# Patient Record
Sex: Female | Born: 1935
Health system: Southern US, Community
[De-identification: ages and names within clinical notes are randomized; demographics above are authoritative.]

## PROBLEM LIST (undated history)

## (undated) DIAGNOSIS — C50919 Malignant neoplasm of unspecified site of unspecified female breast: Secondary | ICD-10-CM

## (undated) DIAGNOSIS — C801 Malignant (primary) neoplasm, unspecified: Secondary | ICD-10-CM

## (undated) HISTORY — PX: CHOLECYSTECTOMY: SHX55

## (undated) HISTORY — PX: BREAST LUMPECTOMY: SHX2

## (undated) HISTORY — DX: Malignant neoplasm of unspecified site of unspecified female breast: C50.919

## (undated) HISTORY — DX: Malignant (primary) neoplasm, unspecified: C80.1

---

## 2007-09-12 DIAGNOSIS — C50211 Malignant neoplasm of upper-inner quadrant of right female breast: Secondary | ICD-10-CM

## 2007-09-12 DIAGNOSIS — C50919 Malignant neoplasm of unspecified site of unspecified female breast: Secondary | ICD-10-CM

## 2007-09-12 HISTORY — DX: Malignant neoplasm of unspecified site of unspecified female breast: C50.919

## 2007-10-09 ENCOUNTER — Ambulatory Visit: Payer: Self-pay | Admitting: Oncology

## 2007-10-11 ENCOUNTER — Encounter: Admission: RE | Admit: 2007-10-11 | Discharge: 2007-10-11 | Payer: Self-pay | Admitting: Surgery

## 2007-10-30 ENCOUNTER — Encounter: Admission: RE | Admit: 2007-10-30 | Discharge: 2007-10-30 | Payer: Self-pay | Admitting: Surgery

## 2007-11-12 ENCOUNTER — Encounter (INDEPENDENT_AMBULATORY_CARE_PROVIDER_SITE_OTHER): Payer: Self-pay | Admitting: Surgery

## 2007-11-12 ENCOUNTER — Ambulatory Visit (HOSPITAL_COMMUNITY): Admission: RE | Admit: 2007-11-12 | Discharge: 2007-11-12 | Payer: Self-pay | Admitting: Surgery

## 2007-11-15 ENCOUNTER — Ambulatory Visit: Payer: Self-pay | Admitting: Genetic Counselor

## 2007-11-28 LAB — CBC WITH DIFFERENTIAL/PLATELET
BASO%: 0.3 % (ref 0.0–2.0)
HCT: 40.7 % (ref 34.8–46.6)
LYMPH%: 28.1 % (ref 14.0–48.0)
MCH: 30 pg (ref 26.0–34.0)
MCHC: 33.8 g/dL (ref 32.0–36.0)
MCV: 88.8 fL (ref 81.0–101.0)
MONO#: 0.4 10*3/uL (ref 0.1–0.9)
MONO%: 7.6 % (ref 0.0–13.0)
NEUT%: 59.8 % (ref 39.6–76.8)
Platelets: 240 10*3/uL (ref 145–400)
RBC: 4.58 10*6/uL (ref 3.70–5.32)
WBC: 5.4 10*3/uL (ref 3.9–10.0)

## 2007-11-29 LAB — COMPREHENSIVE METABOLIC PANEL
ALT: 347 U/L — ABNORMAL HIGH (ref 0–35)
Alkaline Phosphatase: 241 U/L — ABNORMAL HIGH (ref 39–117)
CO2: 27 mEq/L (ref 19–32)
Creatinine, Ser: 0.94 mg/dL (ref 0.40–1.20)
Total Bilirubin: 0.7 mg/dL (ref 0.3–1.2)

## 2007-11-29 LAB — CANCER ANTIGEN 27.29: CA 27.29: 13 U/mL (ref 0–39)

## 2007-11-29 LAB — VITAMIN D 25 HYDROXY (VIT D DEFICIENCY, FRACTURES): Vit D, 25-Hydroxy: 30 ng/mL (ref 30–89)

## 2007-11-29 LAB — LACTATE DEHYDROGENASE: LDH: 225 U/L (ref 94–250)

## 2007-12-03 ENCOUNTER — Ambulatory Visit (HOSPITAL_COMMUNITY): Admission: RE | Admit: 2007-12-03 | Discharge: 2007-12-03 | Payer: Self-pay | Admitting: Oncology

## 2007-12-03 LAB — HEPATIC FUNCTION PANEL
Albumin: 4.3 g/dL (ref 3.5–5.2)
Total Protein: 7 g/dL (ref 6.0–8.3)

## 2007-12-03 LAB — GAMMA GT: GGT: 449 U/L — ABNORMAL HIGH (ref 7–51)

## 2007-12-24 ENCOUNTER — Encounter (INDEPENDENT_AMBULATORY_CARE_PROVIDER_SITE_OTHER): Payer: Self-pay | Admitting: Surgery

## 2007-12-24 ENCOUNTER — Ambulatory Visit (HOSPITAL_COMMUNITY): Admission: RE | Admit: 2007-12-24 | Discharge: 2007-12-24 | Payer: Self-pay | Admitting: Surgery

## 2008-01-01 ENCOUNTER — Ambulatory Visit: Payer: Self-pay | Admitting: Genetic Counselor

## 2008-01-01 LAB — HEPATIC FUNCTION PANEL
ALT: 69 U/L — ABNORMAL HIGH (ref 0–35)
AST: 42 U/L — ABNORMAL HIGH (ref 0–37)
Alkaline Phosphatase: 112 U/L (ref 39–117)
Bilirubin, Direct: 0.1 mg/dL (ref 0.0–0.3)
Indirect Bilirubin: 0.3 mg/dL (ref 0.0–0.9)
Total Protein: 6.9 g/dL (ref 6.0–8.3)

## 2008-01-11 ENCOUNTER — Ambulatory Visit: Admission: RE | Admit: 2008-01-11 | Discharge: 2008-03-21 | Payer: Self-pay | Admitting: Radiation Oncology

## 2008-03-13 ENCOUNTER — Ambulatory Visit: Payer: Self-pay | Admitting: Genetic Counselor

## 2008-06-06 ENCOUNTER — Ambulatory Visit: Payer: Self-pay | Admitting: Genetic Counselor

## 2008-06-10 LAB — CBC WITH DIFFERENTIAL/PLATELET
BASO%: 0.4 % (ref 0.0–2.0)
Basophils Absolute: 0 10*3/uL (ref 0.0–0.1)
HGB: 12.8 g/dL (ref 11.6–15.9)
MCH: 31.4 pg (ref 25.1–34.0)
MONO%: 7.8 % (ref 0.0–14.0)
NEUT#: 2.9 10*3/uL (ref 1.5–6.5)
Platelets: 224 10*3/uL (ref 145–400)
RBC: 4.06 10*6/uL (ref 3.70–5.45)
WBC: 4.6 10*3/uL (ref 3.9–10.3)
lymph#: 1.2 10*3/uL (ref 0.9–3.3)

## 2008-06-11 LAB — VITAMIN D 25 HYDROXY (VIT D DEFICIENCY, FRACTURES): Vit D, 25-Hydroxy: 25 ng/mL — ABNORMAL LOW (ref 30–89)

## 2008-06-11 LAB — COMPREHENSIVE METABOLIC PANEL
Albumin: 3.9 g/dL (ref 3.5–5.2)
BUN: 15 mg/dL (ref 6–23)
Calcium: 9.8 mg/dL (ref 8.4–10.5)
Creatinine, Ser: 1.01 mg/dL (ref 0.40–1.20)
Glucose, Bld: 90 mg/dL (ref 70–99)
Potassium: 4.3 mEq/L (ref 3.5–5.3)
Sodium: 142 mEq/L (ref 135–145)
Total Protein: 6.7 g/dL (ref 6.0–8.3)

## 2008-06-11 LAB — CANCER ANTIGEN 27.29: CA 27.29: 14 U/mL (ref 0–39)

## 2008-06-11 LAB — LACTATE DEHYDROGENASE: LDH: 178 U/L (ref 94–250)

## 2008-12-11 ENCOUNTER — Ambulatory Visit: Payer: Self-pay | Admitting: Oncology

## 2008-12-11 LAB — COMPREHENSIVE METABOLIC PANEL
ALT: 33 U/L (ref 0–35)
AST: 30 U/L (ref 0–37)
Chloride: 104 mEq/L (ref 96–112)
Total Bilirubin: 0.5 mg/dL (ref 0.3–1.2)

## 2008-12-11 LAB — CBC WITH DIFFERENTIAL/PLATELET
EOS%: 2 % (ref 0.0–7.0)
LYMPH%: 26.7 % (ref 14.0–49.7)
MCHC: 34.2 g/dL (ref 31.5–36.0)
NEUT#: 3.1 10*3/uL (ref 1.5–6.5)
NEUT%: 63.8 % (ref 38.4–76.8)
RBC: 4.14 10*6/uL (ref 3.70–5.45)
RDW: 13.2 % (ref 11.2–14.5)

## 2008-12-11 LAB — LACTATE DEHYDROGENASE: LDH: 154 U/L (ref 94–250)

## 2008-12-12 LAB — CANCER ANTIGEN 27.29: CA 27.29: 14 U/mL (ref 0–39)

## 2009-01-13 ENCOUNTER — Ambulatory Visit: Payer: Self-pay | Admitting: Oncology

## 2009-07-29 ENCOUNTER — Ambulatory Visit: Payer: Self-pay | Admitting: Oncology

## 2009-07-31 LAB — CBC WITH DIFFERENTIAL/PLATELET
HCT: 38.6 % (ref 34.8–46.6)
HGB: 13.5 g/dL (ref 11.6–15.9)
MCH: 31 pg (ref 25.1–34.0)
MCV: 88.5 fL (ref 79.5–101.0)
MONO#: 0.4 10*3/uL (ref 0.1–0.9)
MONO%: 6.2 % (ref 0.0–14.0)
NEUT#: 4 10*3/uL (ref 1.5–6.5)
NEUT%: 67.5 % (ref 38.4–76.8)
Platelets: 256 10*3/uL (ref 145–400)

## 2009-07-31 LAB — COMPREHENSIVE METABOLIC PANEL
Chloride: 104 mEq/L (ref 96–112)
Sodium: 139 mEq/L (ref 135–145)
Total Bilirubin: 0.4 mg/dL (ref 0.3–1.2)

## 2010-03-09 ENCOUNTER — Other Ambulatory Visit: Payer: Self-pay | Admitting: Oncology

## 2010-03-09 ENCOUNTER — Encounter (HOSPITAL_BASED_OUTPATIENT_CLINIC_OR_DEPARTMENT_OTHER): Payer: Medicare Other | Admitting: Oncology

## 2010-03-09 DIAGNOSIS — C50219 Malignant neoplasm of upper-inner quadrant of unspecified female breast: Secondary | ICD-10-CM

## 2010-03-09 DIAGNOSIS — Z170 Estrogen receptor positive status [ER+]: Secondary | ICD-10-CM

## 2010-03-09 LAB — COMPREHENSIVE METABOLIC PANEL
ALT: 25 U/L (ref 0–35)
AST: 23 U/L (ref 0–37)
Albumin: 4.1 g/dL (ref 3.5–5.2)
BUN: 16 mg/dL (ref 6–23)
CO2: 24 mEq/L (ref 19–32)
Calcium: 9.5 mg/dL (ref 8.4–10.5)
Chloride: 105 mEq/L (ref 96–112)
Creatinine, Ser: 0.95 mg/dL (ref 0.40–1.20)
Glucose, Bld: 97 mg/dL (ref 70–99)
Potassium: 4.4 mEq/L (ref 3.5–5.3)
Sodium: 141 mEq/L (ref 135–145)
Total Bilirubin: 0.3 mg/dL (ref 0.3–1.2)
Total Protein: 6.4 g/dL (ref 6.0–8.3)

## 2010-03-09 LAB — CBC WITH DIFFERENTIAL/PLATELET
Basophils Absolute: 0 10*3/uL (ref 0.0–0.1)
EOS%: 1.7 % (ref 0.0–7.0)
Eosinophils Absolute: 0.1 10*3/uL (ref 0.0–0.5)
HCT: 38.3 % (ref 34.8–46.6)
HGB: 13.1 g/dL (ref 11.6–15.9)
MCH: 29.9 pg (ref 25.1–34.0)
MCHC: 34.1 g/dL (ref 31.5–36.0)
MCV: 87.6 fL (ref 79.5–101.0)
MONO%: 6.5 % (ref 0.0–14.0)
Platelets: 251 10*3/uL (ref 145–400)
RDW: 13 % (ref 11.2–14.5)
WBC: 4.5 10*3/uL (ref 3.9–10.3)

## 2010-03-09 LAB — VITAMIN D 25 HYDROXY (VIT D DEFICIENCY, FRACTURES): Vit D, 25-Hydroxy: 35 ng/mL (ref 30–89)

## 2010-03-16 ENCOUNTER — Encounter: Payer: Medicare Other | Admitting: Oncology

## 2010-04-02 ENCOUNTER — Other Ambulatory Visit: Payer: Self-pay | Admitting: Obstetrics and Gynecology

## 2010-05-18 NOTE — Op Note (Signed)
[name], [name]            ACCOUNT NO.:  [id number]   MEDICAL RECORD NO.:  [id number]          PATIENT TYPE:  AMB   LOCATION:  SDS                          FACILITY:  [facility]   PHYSICIAN:  Currie Paris, M.D.DATE OF BIRTH:  08/13/1934   DATE OF PROCEDURE:  11/12/2007  DATE OF DISCHARGE:  11/12/2007                               OPERATIVE REPORT   PREOPERATIVE DIAGNOSIS:  Carcinoma, right breast, upper inner quadrant,  clinical stage I.   POSTOPERATIVE DIAGNOSIS:  Carcinoma, right breast, upper inner quadrant,  clinical stage I.   OPERATION:  Needle-guided right lumpectomy with blue dye injection and  sentinel lymph node biopsy (2 nodes).   SURGEON:  Currie Paris, MD   ANESTHESIA:  General.   CLINICAL HISTORY:  This is a 76 year old lady who was recently found to  have a small upper inner quadrant right breast cancer.  It looked  somewhat lobular.  MRI showed no other lesions.  Prior to being  scheduled, she developed an episode of biliary colic, but had resolved  on dietary modification and some antibiotics.  We elected to proceed at  this point with a lumpectomy and sentinel node evaluation with  anticipated need at some point to come back and do her cholecystectomy.   DESCRIPTION OF PROCEDURE:  The patient was seen in the holding area.  She had no further questions.  We confirmed in addition that the right  breast was the operative side.  The radiology technician came to inject  her technetium.  She had no further questions regarding the surgery.   The patient was taken to the operating room.  After satisfactory general  (LMA anesthesia had been obtained), the right breast nipple areolar area  was prepped with some alcohol and a time-out was done.  I then injected  5 mL of dilute methylene blue and massaged this in.   The breast was then prepped and draped.  The radiologist placed a  guidewire which entered medially and tracked laterally and placed an  X  on the skin mark directly overlying the area of the tumor, which was  just medial to the areolar margin at about the 1 o'clock position.   I made an elliptical incision going from the areolar margin to almost  where the guidewire entry site was.  I raised a little bit of a flap  superiorly and inferiorly.  Then medially, I divided the breast tissue  down to the chest wall until I could identify the pectoralis muscle.  I  then came down to the chest wall working first a little superiorly, then  inferiorly and working basically from medial to lateral until I got  beyond the tip of the guidewire.  As I got a little bit more lateral. I  could palpate the tumor, so by palpation I stayed well around it.  There  was a blue dye at the lateral margin of the areola from the injection of  methylene blue.   Once I had this out, I dissected the tissue and I thought I was  completely around the tumor.  I used some inks  to label the margins and  sent this for specimen mammography.   Since the tumor felt closest at the superior lateral margins, I took  some more tissue there to make sure that I had all the tissue off the  chest wall including fascia.  I put in some Marcaine.  I irrigated it to  make sure everything was dry and placed a moist pack.   Attention was turned to the axilla.  Using the Neoprobe, I found a hot  area and made a transverse incision.  I divided some of the subcutaneous  tissue and placed a self-retaining retractor.  Almost immediately, I  identified a blue lymphatic and traced that superiorly and posteriorly  until I identified a blue lymph node, which was excised and had counts  of about 600.  With that excised, I did not see initially any other blue  areas, but I saw a little bit of high counts and a little area of fatty  tissue, although I could not palpate a node in there.  I kept dissecting  in there until I was able to grasp somewhat hot area of fatty tissue  that  within the [place].  I excised that and found what appeared to be a  tiny blue node within it.  This had counts of about 80.   At that point, there were no other counts above 0-10, no palpable  adenopathy and no other blue dye that I could identify.   I placed some Marcaine in here and we then closed both incisions with  layers of 3-0 Vicryl and 4-0 Monocryl subcuticular.   Dr. Tor Netters called to report that the clip was specimen in the middle.  Dr. Luisa Hart reported that the 2 sentinel nodes were negative.   This concluded the case.  The patient tolerated the procedure well.  There were no complications.  All counts were correct.      Currie Paris, M.D.  Electronically Signed     CJS/MEDQ  D:  11/12/2007  T:  11/13/2007  Job:  [id number]   cc:   Jari Favre

## 2010-05-18 NOTE — Op Note (Signed)
[name], [name]            ACCOUNT NO.:  [id number]   MEDICAL RECORD NO.:  [id number]          PATIENT TYPE:  AMB   LOCATION:  DAY                          FACILITY:  [facility]   PHYSICIAN:  Currie Paris, M.D.DATE OF BIRTH:  [birth year]   DATE OF PROCEDURE:  12/24/2007  DATE OF DISCHARGE:                               OPERATIVE REPORT   Office medical [id number].   PREOPERATIVE DIAGNOSES:  1. Chronic calculus cholecystitis.  2. Carcinoma, right breast upper inner quadrant, with positive margin.   POSTOPERATIVE DIAGNOSES:  1. Chronic calculus cholecystitis.  2. Carcinoma, right breast upper inner quadrant, with positive margin.   OPERATION:  1. Laparoscopic cholecystectomy with operative cholangiogram.  2. Re-excision of lumpectomy site, right breast.   SURGEON:  Currie Paris, M.D.   ASSISTANT:  Troy Sine. Dwain Sarna, M.D. (for the laparoscopic  cholecystectomy).   ANESTHESIA:  General.   CLINICAL HISTORY:  This is a 76 year old lady who had recently undergone  lumpectomy and node evaluation for breast cancer.  She had developed  some biliary type symptoms and these have been ongoing since her initial  surgery and so we had planned a laparoscopic cholecystectomy.  In  addition, her medial margin had been not clear on the first lumpectomy,  so we thought we should get additional margin there.  The patient was in  agreement.   DESCRIPTION OF PROCEDURE:  The patient was seen in the holding area and  she had no further questions.  She did point out an area that she was  concerned about in the lateral aspect of her incision which was around  the areolar margin, which I told her I thought was just all  postsurgical.  The patient was then taken to the operating room and  after satisfactory general endotracheal anesthesia had been obtained,  the abdomen and right breast were prepped and draped.  The time-out was  done.   I started with the laparoscopic  cholecystectomy.  Local 0.25% plain  Marcaine was used.  The umbilical incision was made, the fascia  identified and opened, and the peritoneal cavity entered under direct  vision.  The Hasson was introduced and the abdomen insufflated to 15.   With the patient in reverse Trendelenburg and tilted to the left, a  10/11 trocar was placed in the epigastrium and two 5's laterally.   Multiple omental adhesions to the gallbladder were taken down sharply,  avoiding using electrocautery.  I was then able to identify the triangle  of Calot and make a nice big window, see the artery and the vein, and  what looked like perhaps a posterior artery.   I put a clip on the anterior artery and one on the cystic duct.  The  cystic duct was opened and a cholangiogram done, showing good filling of  the common duct and duodenum and hepatic radicles.  There was a question  of a very tiny distal filling defect versus air bubble versus a fold.  However, it was not thought to be clinically significant.   The cystic duct catheter was removed.  A small stone fragment was  manipulated out of the cystic duct and 3 clips were placed on the stay  side and it was divided.  Two additional clips were placed on the  anterior branch of the cystic artery and it was divided.  The  gallbladder was removed from below to above and part way up I found the  posterior branch of the cystic artery, clipped it and divided it.  The  gallbladder was then removed from below to above with coagulation  current of the cautery.   Once this was disconnected, I cauterized the bed of the gallbladder as  this was a somewhat raw surface, and made sure everything was dry.  I  left some Surgicel in.   The gallbladder was placed in a bag and brought out the umbilical port.  The lateral ports were removed and there was no bed bleeding.  The  umbilical site was closed with a pursestring.  The camera was removed  and then the abdomen deflated  through the epigastric port which was  removed.  The skin was closed with 4-0 Monocryl subcuticular and  Dermabond.  The patient had been stable for this portion of the  procedure and all counts were correct in the abdomen.   Attention was turned to the breast and using new instruments and gloves,  changing gloves, I reopened about three-quarters of the old scar and  extended it a little bit medially towards the sternum, as that was the  positive margin.  I made some skin flaps first superiorly, then  inferiorly, then medially, so that I was well around the prior  lumpectomy area.  I then took that down to the chest wall.  I then took  a full-thickness skin of subcutaneous tissue, breast tissue down to and  including fascia, and going from the very medial aspect of my incision  all the way to about the areolar edge to try to get a complete re-  excision of most of the prior cavity.  I did stay out of the old  lumpectomy cavity except for where I  had to transect it laterally or  towards the nipple.   I put Marcaine in here and then closed the incision in layers, trying to  close little bit of the tissue over the muscle.   The skin was closed with 4-0 Monocryl subcuticular.   The patient tolerated the procedure well.  There were no complications.  All counts were correct.      Currie Paris, M.D.  Electronically Signed     CJS/MEDQ  D:  12/24/2007  T:  12/24/2007  Job:  [id number]   cc:   Artist Pais Kathrynn Running, M.D.  Fax: [phone]   Pierce Crane, MD  Fax: [phone]

## 2010-05-24 ENCOUNTER — Encounter (INDEPENDENT_AMBULATORY_CARE_PROVIDER_SITE_OTHER): Payer: Self-pay | Admitting: Surgery

## 2010-09-27 ENCOUNTER — Other Ambulatory Visit: Payer: Self-pay | Admitting: Oncology

## 2010-09-27 ENCOUNTER — Encounter (HOSPITAL_BASED_OUTPATIENT_CLINIC_OR_DEPARTMENT_OTHER): Payer: Medicare Other | Admitting: Oncology

## 2010-09-27 DIAGNOSIS — C50919 Malignant neoplasm of unspecified site of unspecified female breast: Secondary | ICD-10-CM

## 2010-09-27 DIAGNOSIS — E559 Vitamin D deficiency, unspecified: Secondary | ICD-10-CM

## 2010-09-27 DIAGNOSIS — Z923 Personal history of irradiation: Secondary | ICD-10-CM

## 2010-09-27 DIAGNOSIS — Z170 Estrogen receptor positive status [ER+]: Secondary | ICD-10-CM

## 2010-09-27 DIAGNOSIS — C50219 Malignant neoplasm of upper-inner quadrant of unspecified female breast: Secondary | ICD-10-CM

## 2010-09-27 DIAGNOSIS — Z803 Family history of malignant neoplasm of breast: Secondary | ICD-10-CM

## 2010-09-27 LAB — COMPREHENSIVE METABOLIC PANEL
Alkaline Phosphatase: 88 U/L (ref 39–117)
CO2: 23 mEq/L (ref 19–32)
Creatinine, Ser: 0.89 mg/dL (ref 0.50–1.10)
Glucose, Bld: 86 mg/dL (ref 70–99)
Sodium: 140 mEq/L (ref 135–145)
Total Bilirubin: 0.4 mg/dL (ref 0.3–1.2)

## 2010-09-27 LAB — CBC WITH DIFFERENTIAL/PLATELET
Basophils Absolute: 0 10*3/uL (ref 0.0–0.1)
EOS%: 1.5 % (ref 0.0–7.0)
HGB: 13.8 g/dL (ref 11.6–15.9)
MCH: 30.6 pg (ref 25.1–34.0)
MONO#: 0.4 10*3/uL (ref 0.1–0.9)
NEUT#: 3.4 10*3/uL (ref 1.5–6.5)
RDW: 12.8 % (ref 11.2–14.5)
WBC: 5.3 10*3/uL (ref 3.9–10.3)
lymph#: 1.4 10*3/uL (ref 0.9–3.3)

## 2010-09-27 LAB — VITAMIN D 25 HYDROXY (VIT D DEFICIENCY, FRACTURES): Vit D, 25-Hydroxy: 45 ng/mL (ref 30–89)

## 2010-10-05 LAB — CBC
HCT: 41
Hemoglobin: 13.8
MCV: 89.1
WBC: 6.2

## 2010-10-05 LAB — URINALYSIS, ROUTINE W REFLEX MICROSCOPIC
Glucose, UA: NEGATIVE
Ketones, ur: NEGATIVE
Nitrite: NEGATIVE
Protein, ur: NEGATIVE
Urobilinogen, UA: 0.2

## 2010-10-05 LAB — DIFFERENTIAL
Basophils Absolute: 0
Eosinophils Absolute: 0.1
Lymphs Abs: 1.9
Neutrophils Relative %: 60

## 2010-10-05 LAB — COMPREHENSIVE METABOLIC PANEL
Alkaline Phosphatase: 111
BUN: 12
CO2: 27
Chloride: 105
Creatinine, Ser: 0.87
GFR calc non Af Amer: >60
Glucose, Bld: 92
Potassium: 4.8
Total Bilirubin: 0.4

## 2010-10-08 LAB — URINALYSIS, ROUTINE W REFLEX MICROSCOPIC
Bilirubin Urine: NEGATIVE
Ketones, ur: NEGATIVE mg/dL
Nitrite: NEGATIVE
Protein, ur: NEGATIVE mg/dL
Urobilinogen, UA: 0.2 mg/dL (ref 0.0–1.0)
pH: 5.5 (ref 5.0–8.0)

## 2010-10-08 LAB — DIFFERENTIAL
Basophils Relative: 0 % (ref 0–1)
Eosinophils Absolute: 0.1 10*3/uL (ref 0.0–0.7)
Eosinophils Relative: 3 % (ref 0–5)
Lymphs Abs: 1.4 10*3/uL (ref 0.7–4.0)
Monocytes Absolute: 0.4 10*3/uL (ref 0.1–1.0)
Monocytes Relative: 8 % (ref 3–12)
Neutro Abs: 2.6 10*3/uL (ref 1.7–7.7)
Neutrophils Relative %: 58 % (ref 43–77)

## 2010-10-08 LAB — CBC
HCT: 39.7 % (ref 36.0–46.0)
Hemoglobin: 13.4 g/dL (ref 12.0–15.0)
MCHC: 33.6 g/dL (ref 30.0–36.0)
MCV: 88.4 fL (ref 78.0–100.0)
RBC: 4.49 MIL/uL (ref 3.87–5.11)
RDW: 12.9 % (ref 11.5–15.5)

## 2010-10-08 LAB — COMPREHENSIVE METABOLIC PANEL
ALT: 174 U/L — ABNORMAL HIGH (ref 0–35)
BUN: 10 mg/dL (ref 6–23)
CO2: 26 mEq/L (ref 19–32)
Calcium: 9.5 mg/dL (ref 8.4–10.5)
GFR calc non Af Amer: >60 mL/min (ref >60)
Glucose, Bld: 98 mg/dL (ref 70–99)
Sodium: 141 mEq/L (ref 135–145)
Total Protein: 6.3 g/dL (ref 6.0–8.3)

## 2010-11-30 NOTE — Progress Notes (Signed)
CC:   Currie Paris, M.D. Oneita Hurt, M.D. Henry Russel  PROBLEMS: 1. ER/PR positive T1c N1 breast cancer, status post lumpectomy with re-     excision on 01/01/2008, status post radiation therapy completed     03/14/2008, on Femara. 2. Strong family history of breast cancer with variant of unknown     significance by genetic testing.  INTERVAL HISTORY:  Ms. Campoy returns for followup, overall doing pretty well.  She continues on Femara, which she tolerates fairly well. She has not had any significant intercurrent illnesses.  Appetite is good, weight is stable.  Denies headache, blurry vision, shortness of breath, or cough.  She denies any nausea, vomiting, numbness or tingling in her hands and feet.  Most recent mammogram done at [facility] on 09/20/2010 did not show any obvious abnormalities.  Most recent bone density test was done December 2011, which showed normal T scores.  MEDICATIONS:  Medication list was reviewed.  PHYSICAL EXAMINATION:  General:  Pleasant alert woman, looking stated age.  Vital Signs:  Blood pressure is 140/86, temperature 96.4, pulse 72, respiratory rate 20, weight is 179.  Head and Neck:  No palpable adenopathy in the head and neck area.  Oropharynx normal.  Lungs: Clear.  Heart:  Heart sounds are normal.  Breasts:  Right and left breasts are free of any obvious masses.  Both axillae are negative. Right lumpectomy site is within normal limits.  No nipple retraction or skin changes.  Abdomen:  Soft.  No palpable hepatosplenomegaly.  No inguinal adenopathy.  Extremities:  No peripheral cyanosis, clubbing, or edema.  IMPRESSION AND PLAN:  Ms. Siers is doing well.  No immediate family members have been tested for breast cancer gene.  I will see her again in followup in 6 months' time with appropriate imaging studies.    ______________________________ Pierce Crane, M.D., F.R.C.P.C. PR/MEDQ  D:  11/28/2010  T:  11/30/2010  Job:  [id number]

## 2010-12-07 ENCOUNTER — Telehealth: Payer: Self-pay | Admitting: *Deleted

## 2010-12-07 NOTE — Telephone Encounter (Signed)
patient confirmed over the phone the new date and time on 03-29-2011 starting at 11:00am with labs

## 2010-12-21 ENCOUNTER — Encounter (INDEPENDENT_AMBULATORY_CARE_PROVIDER_SITE_OTHER): Payer: Self-pay | Admitting: General Surgery

## 2010-12-24 ENCOUNTER — Encounter (INDEPENDENT_AMBULATORY_CARE_PROVIDER_SITE_OTHER): Payer: Self-pay | Admitting: Surgery

## 2010-12-24 ENCOUNTER — Ambulatory Visit (INDEPENDENT_AMBULATORY_CARE_PROVIDER_SITE_OTHER): Payer: Medicare Other | Admitting: Surgery

## 2010-12-24 VITALS — BP 132/88 | HR 76 | Temp 97.4°F | Resp 16 | Ht 63.0 in | Wt 181.0 lb

## 2010-12-24 DIAGNOSIS — Z853 Personal history of malignant neoplasm of breast: Secondary | ICD-10-CM

## 2010-12-24 NOTE — Progress Notes (Signed)
NAME: [name]       DOB: 11/21/1934           DATE: 12/24/2010       MRN: [id number]   [name] is a 76 y.o.Marland Kitchenfemale who presents for routine followup of her Right breast cancer, IDC, Rigght UIQ, diagnosed in 2009 and treated with Lumpectomy, re-excision for margin, radiation and anti-estrogen. She has no problems or concerns on either side.  PFSH: She has had no significant changes since the last visit here.  ROS: There have been no significant changes since the last visit here  EXAM: General: The patient is alert, oriented, generally healty appearing, NAD. Mood and affect are normal.  Breasts:  The right breast has a well-healed surgical scar in the upper inner quadrant. To palpation there is some loss of tissue there. The entire breast is a little bit tender. There are no suspicious areas and no evidence of significant radiation change. The left breast completely normal.  Lymphatics: She has no axillary or supraclavicular adenopathy on either side.  Extremities: Full ROM of the surgical side with no lymphedema noted.  Data Reviewed: Recent mammogram at [facility] was negative  Impression: Doing well, with no evidence of recurrent cancer or new cancer  Plan: Will continue to follow up on an annual basis here.

## 2010-12-24 NOTE — Patient Instructions (Signed)
Continue annual mammograms and follow ups withme

## 2010-12-29 ENCOUNTER — Telehealth: Payer: Self-pay | Admitting: *Deleted

## 2010-12-29 NOTE — Telephone Encounter (Signed)
patient confirmed over the phone the new date and time on 04-01-2011 starting at 11::30 with labs

## 2011-01-13 ENCOUNTER — Encounter (INDEPENDENT_AMBULATORY_CARE_PROVIDER_SITE_OTHER): Payer: Self-pay | Admitting: Oncology

## 2011-03-29 ENCOUNTER — Ambulatory Visit: Payer: Medicare Other | Admitting: Oncology

## 2011-03-29 ENCOUNTER — Other Ambulatory Visit: Payer: Medicare Other | Admitting: Lab

## 2011-04-01 ENCOUNTER — Other Ambulatory Visit (HOSPITAL_BASED_OUTPATIENT_CLINIC_OR_DEPARTMENT_OTHER): Payer: Medicare Other | Admitting: Lab

## 2011-04-01 ENCOUNTER — Telehealth: Payer: Self-pay | Admitting: Oncology

## 2011-04-01 ENCOUNTER — Ambulatory Visit (HOSPITAL_BASED_OUTPATIENT_CLINIC_OR_DEPARTMENT_OTHER): Payer: Medicare Other | Admitting: Oncology

## 2011-04-01 VITALS — BP 123/79 | HR 85 | Temp 98.1°F | Ht 63.0 in | Wt 174.3 lb

## 2011-04-01 DIAGNOSIS — C50919 Malignant neoplasm of unspecified site of unspecified female breast: Secondary | ICD-10-CM

## 2011-04-01 DIAGNOSIS — C50219 Malignant neoplasm of upper-inner quadrant of unspecified female breast: Secondary | ICD-10-CM

## 2011-04-01 DIAGNOSIS — E559 Vitamin D deficiency, unspecified: Secondary | ICD-10-CM

## 2011-04-01 LAB — CBC WITH DIFFERENTIAL/PLATELET
BASO%: 0.5 % (ref 0.0–2.0)
EOS%: 1.8 % (ref 0.0–7.0)
HCT: 38.8 % (ref 34.8–46.6)
LYMPH%: 24.9 % (ref 14.0–49.7)
MCH: 29.4 pg (ref 25.1–34.0)
MCHC: 33.3 g/dL (ref 31.5–36.0)
MCV: 88.3 fL (ref 79.5–101.0)
MONO%: 7.5 % (ref 0.0–14.0)
NEUT%: 65.3 % (ref 38.4–76.8)
Platelets: 218 10*3/uL (ref 145–400)
RBC: 4.39 10*6/uL (ref 3.70–5.45)

## 2011-04-01 LAB — COMPREHENSIVE METABOLIC PANEL
AST: 23 U/L (ref 0–37)
Albumin: 3.8 g/dL (ref 3.5–5.2)
Alkaline Phosphatase: 91 U/L (ref 39–117)
Calcium: 9.5 mg/dL (ref 8.4–10.5)
Chloride: 106 mEq/L (ref 96–112)
Potassium: 4.4 mEq/L (ref 3.5–5.3)
Sodium: 141 mEq/L (ref 135–145)
Total Protein: 6.2 g/dL (ref 6.0–8.3)

## 2011-04-01 MED ORDER — TAMOXIFEN CITRATE 20 MG PO TABS: 20.0000 mg | ORAL_TABLET | Freq: Every day | ORAL | Status: AC

## 2011-04-01 NOTE — Progress Notes (Signed)
Hematology and Oncology Follow Up Visit  [name] [id number] [birth year] 77 y.o. 04/01/2011 12:05 PM PCP  1. Principle Diagnosis: ER/PR positive T1c N1 breast cancer, status post lumpectomy with re-excision on 01/01/2008, status post radiation therapy completed 03/14/2008, on Femara.  Strong family history of breast cancer with variant of unknown significance by genetic testing  Interim History:  There have been no intercurrent illness, hospitalizations or medication changes.Having some joint pains in fingers and toes.  Medications: I have reviewed the patient's current medications.  Allergies: No Known Allergies  Past Medical History, Surgical history, Social history, and Family History were reviewed and updated.  Review of Systems: Constitutional:  Negative for fever, chills, night sweats, anorexia, weight loss, pain. Cardiovascular: negative Respiratory: no cough, shortness of breath, or wheezing Neurological: negative Dermatological: negative ENT: negative Skin Gastrointestinal: no abdominal pain, change in bowel habits, or black or bloody stools Genito-Urinary: negative Hematological and Lymphatic: negative Breast: negative Musculoskeletal: negative Remaining ROS negative.  Physical Exam: Blood pressure 123/79, pulse 85, temperature 98.1 F (36.7 C), height 5\' 3"  (1.6 m), weight 174 lb 4.8 oz (79.062 kg). ECOG: 0 General appearance: alert, cooperative and appears stated age Head: Normocephalic, without obvious abnormality, atraumatic Neck: no adenopathy, no carotid bruit, no JVD, supple, symmetrical, trachea midline and thyroid not enlarged, symmetric, no tenderness/mass/nodules Lymph nodes: Cervical, supraclavicular, and axillary nodes normal. Cardiac : regular rate and rhythm, no murmurs or gallops Pulmonary:clear to auscultation bilaterally and normal percussion bilaterally Breasts: inspection negative, no nipple discharge or bleeding, no masses or  nodularity palpable Abdomen:soft, non-tender; bowel sounds normal; no masses,  no organomegaly Extremities negative Neuro: alert, oriented, normal speech, no focal findings or movement disorder noted  Lab Results: Lab Results  Component Value Date   WBC 5.0 04/01/2011   HGB 12.9 04/01/2011   HCT 38.8 04/01/2011   MCV 88.3 04/01/2011   PLT 218 04/01/2011     Chemistry      Component Value Date/Time   NA 140 09/27/2010 1318   NA 140 09/27/2010 1318   NA 140 09/27/2010 1318   K 4.2 09/27/2010 1318   K 4.2 09/27/2010 1318   K 4.2 09/27/2010 1318   CL 105 09/27/2010 1318   CL 105 09/27/2010 1318   CL 105 09/27/2010 1318   CO2 23 09/27/2010 1318   CO2 23 09/27/2010 1318   CO2 23 09/27/2010 1318   BUN 12 09/27/2010 1318   BUN 12 09/27/2010 1318   BUN 12 09/27/2010 1318   CREATININE 0.89 09/27/2010 1318   CREATININE 0.89 09/27/2010 1318   CREATININE 0.89 09/27/2010 1318      Component Value Date/Time   CALCIUM 9.8 09/27/2010 1318   CALCIUM 9.8 09/27/2010 1318   CALCIUM 9.8 09/27/2010 1318   ALKPHOS 88 09/27/2010 1318   ALKPHOS 88 09/27/2010 1318   ALKPHOS 88 09/27/2010 1318   AST 23 09/27/2010 1318   AST 23 09/27/2010 1318   AST 23 09/27/2010 1318   ALT 22 09/27/2010 1318   ALT 22 09/27/2010 1318   ALT 22 09/27/2010 1318   BILITOT 0.4 09/27/2010 1318   BILITOT 0.4 09/27/2010 1318   BILITOT 0.4 09/27/2010 1318      .pathology. Radiological Studies: chest X-ray n/a Mammogram 9/13 Bone density n/a  Impression and Plan: [name] has asked to change to tamoxifen, which  I agreed to abd discussed possible s/e.  I will see her in 6 months.  More than 50% of the visit was spent in patient-related  counselling   Pierce Crane, MD 3/29/201312:05 PM

## 2011-04-01 NOTE — Telephone Encounter (Signed)
gve the pt her oct 2013 appts °

## 2011-05-04 ENCOUNTER — Telehealth: Payer: Self-pay | Admitting: Genetic Counselor

## 2011-05-04 NOTE — Telephone Encounter (Signed)
S/w pt re genetics appt for 5/23. Date per pt. appt scheduled per email from Annia Friendly.

## 2011-05-26 ENCOUNTER — Encounter: Payer: Self-pay | Admitting: Genetic Counselor

## 2011-05-26 ENCOUNTER — Ambulatory Visit (HOSPITAL_BASED_OUTPATIENT_CLINIC_OR_DEPARTMENT_OTHER): Payer: Medicare Other | Admitting: Genetic Counselor

## 2011-05-26 ENCOUNTER — Other Ambulatory Visit: Payer: Medicare Other | Admitting: Lab

## 2011-05-26 DIAGNOSIS — C50919 Malignant neoplasm of unspecified site of unspecified female breast: Secondary | ICD-10-CM

## 2011-05-26 NOTE — Progress Notes (Signed)
[name], a 77 y.o. female, came in for a discussion of her BRCA2 variant of unknown significance (VUS) and to have blood drawn for a cancer panel. Sh3 presents to clinic today to discuss the possibility of a genetic predisposition to cancer, and to further clarify her risks, as well as her family members' risks for cancer.   HISTORY OF PRESENT ILLNESS: In 2009, at the age of 61, [name] was diagnosed with Breast cancer.  At that time, she was tested for BRCA1 and BRCA2 mutations.  She was found to have a BRCA2 VUS.    Past Medical History  Diagnosis Date  . Cancer     rt breast  . hx: breast cancer, right UIQ, invasive, receptor + her 2 - 09/12/2007    Past Surgical History  Procedure Date  . Breast lumpectomy     x2 right breast    History  Substance Use Topics  . Smoking status: Former Games developer  . Smokeless tobacco: Not on file   Comment: quit in 1976  . Alcohol Use: No    FAMILY HISTORY:  We obtained a detailed, 4-generation family history.  Significant diagnoses are listed below: Family History  Problem Relation Age of Onset  . Cancer Mother     breast (76), leukemia (64), colon (80),   . Cancer Father     prostate  . Cancer Sister     breast (40s), colon (72), currently has alzheimers  . Cancer Brother 28    colon, prostate, liver  . Cancer Paternal Grandmother     pancreatic  . Cancer Other 45    breast cancer, BRCA1 VUS and BRCA2 VUS  . Cancer Other 50    breast cancer  . Other Daughter     BRCA2 VUS  . Cancer Cousin 60    breast cancer, BRCA2 varient   . Cancer Cousin 50    breast  The patient was diagnosed with breast cancer at age 56, and was found to have a BRCA2 VUS.  Her daughter was also tested and found to have the same variant.  The patient's sister was diagnosed with breast cancer in her 77s and colon cancer in her 67s.  She currently has Alzheimer's disease.  This sister had four daughters, two daughters were diagnosed with  breast cancer.  One daughter was tested and found to have the same VUS in both her BRCA2 gene and another VUS in her BRCA1 gene.  The patient's brother was diagnosed with colon, liver and prostate cancer at age 2.  The patients father was diagnosed with prostate cancer and died at age 45.  He had a twin sister who had two daughters with breast cancer.  One daughter was tested and found to have a different variant that the patient in her BRCA2 gene.  The patient's paternal grandmother was diagnosed with pancreatic cancer.  The patient's mother was diagnosed with leukemia at age 13, breast cancer at age 6 and colon cancer at age 14.  She died at age 45.  Her mother had on sister who was diagnosed in her 39s with breast and brain cancer and died at age 33.  Patient's ancestry is [place] and [place] descent.  GENETIC COUNSELING RISK ASSESSMENT, DISCUSSION, AND SUGGESTED FOLLOW UP: We reviewed the natural history and genetic etiology of sporadic, familial and hereditary cancer syndromes.  We discussed that in the past few years her BRCA2 VUS has not been reclassified.  Therefore, at this time we  do not recommend genetic testing other family members as we would not change their care based on the test result.  We reviewed the cancers in her family, and discussed that they are not all related.  Therefore,  we could perform a gene panels, which tests for several cancer genes that are associated with different cancers syndromes.    The patient's personal and family history of cancer is suggestive of the following possible diagnosis: hereditary cancer syndrome  We discussed that identification of a hereditary cancer syndrome may help her care providers tailor the patients medical management. If a mutation indicating hereditary cancer syndrome is detected in this case, the [employer] recommendations would include increased cancer surveillance and possible prophylactic surgery. If a mutation  is detected, the patient will be referred back to the referring provider and to any additional appropriate care providers to discuss the relevant options.   If a mutation is not found in the patient, this will decrease the likelihood of hereditary cancer syndrome as the explanation for her breast cancer. Cancer surveillance options would be discussed for the patient according to the appropriate standard National Comprehensive Cancer Network and American Cancer Society guidelines, with consideration of their personal and family history risk factors. In this case, the patient will be referred back to their care providers for discussions of management.   After considering the risks, benefits, and limitations, the patient provided informed consent for  the following testing: [facility] through [employer]   Per the patient's request, we will contact her by telephone to discuss these results. A follow up genetic counseling visit will be scheduled if indicated.  The patient was seen for a total of 45 minutes, greater than 50% of which was spent face-to-face counseling.  This plan is being carried out per Dr. Renelda Loma recommendations.  This note will also be sent to the referring provider via the electronic medical record. The patient will be supplied with a summary of this genetic counseling discussion as well as educational information on the discussed hereditary cancer syndromes following the conclusion of their visit.   Patient was discussed with Dr. Drue Second.  _______________________________________________________________________ For Office Staff:  Number of people involved in session: 3 Was an Intern/ student involved with case: yes

## 2011-08-23 ENCOUNTER — Telehealth: Payer: Self-pay | Admitting: Genetic Counselor

## 2011-08-23 ENCOUNTER — Encounter: Payer: Self-pay | Admitting: Genetic Counselor

## 2011-08-23 NOTE — Telephone Encounter (Signed)
LEft message that we had genetic test results back and to please call back.

## 2011-08-23 NOTE — Telephone Encounter (Signed)
Revealed negative CancerNext testing. 

## 2011-10-04 ENCOUNTER — Other Ambulatory Visit (HOSPITAL_BASED_OUTPATIENT_CLINIC_OR_DEPARTMENT_OTHER): Payer: Medicare Other | Admitting: Lab

## 2011-10-04 ENCOUNTER — Ambulatory Visit (HOSPITAL_BASED_OUTPATIENT_CLINIC_OR_DEPARTMENT_OTHER): Payer: Medicare Other | Admitting: Oncology

## 2011-10-04 ENCOUNTER — Telehealth: Payer: Self-pay | Admitting: Oncology

## 2011-10-04 VITALS — BP 129/81 | HR 69 | Temp 98.1°F | Resp 20 | Ht 63.0 in | Wt 170.3 lb

## 2011-10-04 DIAGNOSIS — C50919 Malignant neoplasm of unspecified site of unspecified female breast: Secondary | ICD-10-CM

## 2011-10-04 DIAGNOSIS — C50219 Malignant neoplasm of upper-inner quadrant of unspecified female breast: Secondary | ICD-10-CM

## 2011-10-04 DIAGNOSIS — E559 Vitamin D deficiency, unspecified: Secondary | ICD-10-CM

## 2011-10-04 DIAGNOSIS — Z170 Estrogen receptor positive status [ER+]: Secondary | ICD-10-CM

## 2011-10-04 LAB — CBC WITH DIFFERENTIAL/PLATELET
BASO%: 0.3 % (ref 0.0–2.0)
Basophils Absolute: 0 10*3/uL (ref 0.0–0.1)
EOS%: 1.4 % (ref 0.0–7.0)
HGB: 12.6 g/dL (ref 11.6–15.9)
MCH: 30.6 pg (ref 25.1–34.0)
MCHC: 34.3 g/dL (ref 31.5–36.0)
MCV: 89.1 fL (ref 79.5–101.0)
MONO%: 7.6 % (ref 0.0–14.0)
RBC: 4.12 10*6/uL (ref 3.70–5.45)
RDW: 13 % (ref 11.2–14.5)

## 2011-10-04 LAB — COMPREHENSIVE METABOLIC PANEL (CC13)
Alkaline Phosphatase: 72 U/L (ref 40–150)
Glucose: 96 mg/dl (ref 70–99)
Sodium: 141 mEq/L (ref 136–145)
Total Bilirubin: 0.2 mg/dL (ref 0.20–1.20)
Total Protein: 6 g/dL — ABNORMAL LOW (ref 6.4–8.3)

## 2011-10-04 NOTE — Progress Notes (Signed)
Hematology and Oncology Follow Up Visit  [name] [id number] 12-25-1934 77 y.o. 10/04/2011 1:18 PM PCP  1. Principle Diagnosis: ER/PR positive T1c N1 breast cancer, status post lumpectomy with re-excision on 01/01/2008, status post radiation therapy completed 03/14/2008, on Femara.  Strong family history of breast cancer with variant of unknown significance by genetic testing  Interim History:  There have been no intercurrent illness, hospitalizations or medication changes.Having some joint pains in fingers and toes, she has been doing well. She had a mammogram last month.  Medications: I have reviewed the patient's current medications.  Allergies: No Known Allergies  Past Medical History, Surgical history, Social history, and Family History were reviewed and updated.  Review of Systems: Constitutional:  Negative for fever, chills, night sweats, anorexia, weight loss, pain. Cardiovascular: negative Respiratory: no cough, shortness of breath, or wheezing Neurological: negative Dermatological: negative ENT: negative Skin Gastrointestinal: no abdominal pain, change in bowel habits, or black or bloody stools Genito-Urinary: negative Hematological and Lymphatic: negative Breast: negative Musculoskeletal: negative Remaining ROS negative.  Physical Exam: Blood pressure 129/81, pulse 69, temperature 98.1 F (36.7 C), resp. rate 20, height 5\' 3"  (1.6 m), weight 170 lb 4.8 oz (77.248 kg). ECOG: 0 General appearance: alert, cooperative and appears stated age Head: Normocephalic, without obvious abnormality, atraumatic Neck: no adenopathy, no carotid bruit, no JVD, supple, symmetrical, trachea midline and thyroid not enlarged, symmetric, no tenderness/mass/nodules Lymph nodes: Cervical, supraclavicular, and axillary nodes normal. Cardiac : regular rate and rhythm, no murmurs or gallops Pulmonary:clear to auscultation bilaterally and normal percussion bilaterally Breasts:  inspection negative, no nipple discharge or bleeding, no masses or nodularity palpable, rt breast s/p lumpectomy. Abdomen:soft, non-tender; bowel sounds normal; no masses,  no organomegaly Extremities negative Neuro: alert, oriented, normal speech, no focal findings or movement disorder noted  Lab Results: Lab Results  Component Value Date   WBC 4.8 10/04/2011   HGB 12.6 10/04/2011   HCT 36.7 10/04/2011   MCV 89.1 10/04/2011   PLT 200 10/04/2011     Chemistry      Component Value Date/Time   NA 141 10/04/2011 1130   NA 141 04/01/2011 1106   K 4.0 10/04/2011 1130   K 4.4 04/01/2011 1106   CL 108* 10/04/2011 1130   CL 106 04/01/2011 1106   CO2 26 10/04/2011 1130   CO2 25 04/01/2011 1106   BUN 10.0 10/04/2011 1130   BUN 15 04/01/2011 1106   CREATININE 0.8 10/04/2011 1130   CREATININE 1.03 04/01/2011 1106      Component Value Date/Time   CALCIUM 9.8 10/04/2011 1130   CALCIUM 9.5 04/01/2011 1106   ALKPHOS 72 10/04/2011 1130   ALKPHOS 91 04/01/2011 1106   AST 29 10/04/2011 1130   AST 23 04/01/2011 1106   ALT 29 10/04/2011 1130   ALT 20 04/01/2011 1106   BILITOT 0.20 10/04/2011 1130   BILITOT 0.3 04/01/2011 1106      .pathology. Radiological Studies: chest X-ray n/a Mammogram 9/13 Bone density n/a  Impression and Plan: Ms [name] is doing well, she is tolerating tamoxifen, I will see her in 6 months.  More than 50% of the visit was spent in patient-related counselling   Pierce Crane, MD 10/1/20131:18 PM

## 2011-10-04 NOTE — Telephone Encounter (Signed)
gve the pt her April 2014 appt calendar °

## 2012-01-10 ENCOUNTER — Encounter (INDEPENDENT_AMBULATORY_CARE_PROVIDER_SITE_OTHER): Payer: Self-pay | Admitting: Surgery

## 2012-01-10 ENCOUNTER — Ambulatory Visit (INDEPENDENT_AMBULATORY_CARE_PROVIDER_SITE_OTHER): Payer: Medicare Other | Admitting: Surgery

## 2012-01-10 VITALS — BP 102/70 | HR 68 | Resp 16 | Ht 62.0 in | Wt 168.0 lb

## 2012-01-10 DIAGNOSIS — L7211 Pilar cyst: Secondary | ICD-10-CM

## 2012-01-10 DIAGNOSIS — C50919 Malignant neoplasm of unspecified site of unspecified female breast: Secondary | ICD-10-CM

## 2012-01-10 NOTE — Progress Notes (Signed)
NAME: [name]       DOB: [birth year]           DATE: 01/10/2012       MRN: [id number]   CARMIN ALVIDREZ is a 78 y.o.[place] Kitchenfemale who presents for routine followup of her Right breast cancer, IDC, Right UIQ, receptor +,diagnosed in 2009 and treated with Lumpectomy, re-excision for margin, radiation and anti-estrogen. She has no problems or concerns on either side.  PFSH: She has had no significant changes since the last visit here.  ROS: There have been no significant changes since the last visit here  EXAM: General: The patient is alert, oriented, generally healthy appearing, NAD. Mood and affect are normal.  Head: She has two small 1 cm well circumscribed, mobile sub q nodules, one anterior scalp and one more posterior  Breasts:  The right breast has a well-healed surgical scar in the upper inner quadrant. To palpation there is some loss of tissue there. The entire breast is a little bit tender. There are no suspicious areas and no evidence of significant radiation change. The left breast completely normal, except for chronic nipple inversion, which the patient says has been present since she nursed her first child. There is some whitish material at the base, c/w old skin slough  Lymphatics: She has no axillary or supraclavicular adenopathy on either side.  Extremities: Full ROM of the surgical side with no lymphedema noted.  Data Reviewed: Recent mammogram at [facility] in September, 2013, no problems  Impression: Doing well, with no evidence of recurrent cancer or new cancer; two pilar cysts  Plan: RTC PRN

## 2012-01-10 NOTE — Patient Instructions (Signed)
Continued annual mammograms, see me if you have any other problems. If small cysts on her scalp bother you come back and we can talk about removing these as an outpatient.

## 2012-02-28 ENCOUNTER — Telehealth: Payer: Self-pay | Admitting: *Deleted

## 2012-02-28 NOTE — Telephone Encounter (Signed)
Called and spoke with patient to reschedule her appt. Confirmed appt. For 04/24/12 at 115 with Melissa Cross,NP. Then will become Dr. Welton Flakes.

## 2012-03-12 ENCOUNTER — Encounter: Payer: Self-pay | Admitting: *Deleted

## 2012-03-12 ENCOUNTER — Encounter: Payer: Self-pay | Admitting: Oncology

## 2012-03-12 NOTE — Progress Notes (Signed)
Mailed letter & calendar to pt. 

## 2012-04-23 ENCOUNTER — Encounter: Payer: Self-pay | Admitting: Gynecologic Oncology

## 2012-04-24 ENCOUNTER — Ambulatory Visit: Payer: Medicare Other | Admitting: Oncology

## 2012-04-24 ENCOUNTER — Encounter: Payer: Self-pay | Admitting: Gynecologic Oncology

## 2012-04-24 ENCOUNTER — Ambulatory Visit (HOSPITAL_BASED_OUTPATIENT_CLINIC_OR_DEPARTMENT_OTHER): Payer: Medicare Other | Admitting: Gynecologic Oncology

## 2012-04-24 ENCOUNTER — Telehealth: Payer: Self-pay | Admitting: Oncology

## 2012-04-24 ENCOUNTER — Other Ambulatory Visit: Payer: Medicare Other | Admitting: Lab

## 2012-04-24 ENCOUNTER — Ambulatory Visit (HOSPITAL_BASED_OUTPATIENT_CLINIC_OR_DEPARTMENT_OTHER): Payer: Medicare Other | Admitting: Lab

## 2012-04-24 VITALS — BP 135/72 | HR 73 | Temp 98.9°F | Resp 20 | Ht 62.0 in | Wt 170.0 lb

## 2012-04-24 DIAGNOSIS — Z170 Estrogen receptor positive status [ER+]: Secondary | ICD-10-CM

## 2012-04-24 DIAGNOSIS — Z853 Personal history of malignant neoplasm of breast: Secondary | ICD-10-CM

## 2012-04-24 DIAGNOSIS — C50219 Malignant neoplasm of upper-inner quadrant of unspecified female breast: Secondary | ICD-10-CM

## 2012-04-24 LAB — COMPREHENSIVE METABOLIC PANEL (CC13)
ALT: 31 U/L (ref 0–55)
BUN: 14.6 mg/dL (ref 7.0–26.0)
CO2: 28 mEq/L (ref 22–29)
Calcium: 9.5 mg/dL (ref 8.4–10.4)
Creatinine: 1 mg/dL (ref 0.6–1.1)
Glucose: 91 mg/dl (ref 70–99)
Total Bilirubin: 0.24 mg/dL (ref 0.20–1.20)

## 2012-04-24 LAB — CBC WITH DIFFERENTIAL/PLATELET
BASO%: 0.5 % (ref 0.0–2.0)
Basophils Absolute: 0 10*3/uL (ref 0.0–0.1)
HCT: 38 % (ref 34.8–46.6)
HGB: 12.5 g/dL (ref 11.6–15.9)
LYMPH%: 33.8 % (ref 14.0–49.7)
MCHC: 33 g/dL (ref 31.5–36.0)
MONO#: 0.4 10*3/uL (ref 0.1–0.9)
NEUT%: 57.4 % (ref 38.4–76.8)
Platelets: 205 10*3/uL (ref 145–400)
WBC: 6.3 10*3/uL (ref 3.9–10.3)

## 2012-04-24 NOTE — Patient Instructions (Addendum)
Doing well.  We will contact you with the results of your lab work.  Plan to follow up with Dr. Welton Flakes in six months or sooner if needed.  Please arrange to have an annual physical examination with your primary care provider.  Please call for any questions or concerns.
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
## 2012-04-25 ENCOUNTER — Telehealth: Payer: Self-pay | Admitting: Gynecologic Oncology

## 2012-04-25 NOTE — Telephone Encounter (Signed)
Patient notified of lab results.  Instructed to call for any concerns or needs.

## 2012-05-09 ENCOUNTER — Other Ambulatory Visit: Payer: Self-pay | Admitting: Oncology

## 2012-05-09 DIAGNOSIS — C50919 Malignant neoplasm of unspecified site of unspecified female breast: Secondary | ICD-10-CM

## 2012-07-10 ENCOUNTER — Telehealth: Payer: Self-pay | Admitting: Oncology

## 2012-10-31 ENCOUNTER — Other Ambulatory Visit: Payer: Medicare Other | Admitting: Lab

## 2012-10-31 ENCOUNTER — Ambulatory Visit: Payer: Medicare Other | Admitting: Oncology

## 2012-11-01 ENCOUNTER — Other Ambulatory Visit (HOSPITAL_BASED_OUTPATIENT_CLINIC_OR_DEPARTMENT_OTHER): Payer: Medicare Other | Admitting: Lab

## 2012-11-01 ENCOUNTER — Ambulatory Visit (HOSPITAL_BASED_OUTPATIENT_CLINIC_OR_DEPARTMENT_OTHER): Payer: Medicare Other | Admitting: Oncology

## 2012-11-01 ENCOUNTER — Telehealth: Payer: Self-pay | Admitting: Oncology

## 2012-11-01 ENCOUNTER — Encounter: Payer: Self-pay | Admitting: Oncology

## 2012-11-01 VITALS — BP 126/82 | HR 73 | Temp 98.5°F | Resp 18 | Ht 62.0 in | Wt 169.4 lb

## 2012-11-01 DIAGNOSIS — C50219 Malignant neoplasm of upper-inner quadrant of unspecified female breast: Secondary | ICD-10-CM

## 2012-11-01 DIAGNOSIS — C50911 Malignant neoplasm of unspecified site of right female breast: Secondary | ICD-10-CM

## 2012-11-01 DIAGNOSIS — Z853 Personal history of malignant neoplasm of breast: Secondary | ICD-10-CM

## 2012-11-01 DIAGNOSIS — Z170 Estrogen receptor positive status [ER+]: Secondary | ICD-10-CM

## 2012-11-01 DIAGNOSIS — C50919 Malignant neoplasm of unspecified site of unspecified female breast: Secondary | ICD-10-CM

## 2012-11-01 LAB — CBC WITH DIFFERENTIAL/PLATELET
Basophils Absolute: 0 10*3/uL (ref 0.0–0.1)
Eosinophils Absolute: 0.1 10*3/uL (ref 0.0–0.5)
HCT: 38.6 % (ref 34.8–46.6)
HGB: 12.8 g/dL (ref 11.6–15.9)
LYMPH%: 28.2 % (ref 14.0–49.7)
MONO#: 0.4 10*3/uL (ref 0.1–0.9)
NEUT#: 3.1 10*3/uL (ref 1.5–6.5)
NEUT%: 62 % (ref 38.4–76.8)
Platelets: 214 10*3/uL (ref 145–400)
WBC: 4.9 10*3/uL (ref 3.9–10.3)
lymph#: 1.4 10*3/uL (ref 0.9–3.3)

## 2012-11-01 LAB — COMPREHENSIVE METABOLIC PANEL (CC13)
Albumin: 3.5 g/dL (ref 3.5–5.0)
Alkaline Phosphatase: 56 U/L (ref 40–150)
Anion Gap: 10 mEq/L (ref 3–11)
BUN: 14.3 mg/dL (ref 7.0–26.0)
CO2: 22 mEq/L (ref 22–29)
Creatinine: 0.9 mg/dL (ref 0.6–1.1)
Glucose: 85 mg/dl (ref 70–140)
Potassium: 4.4 mEq/L (ref 3.5–5.1)
Sodium: 141 mEq/L (ref 136–145)
Total Protein: 6.7 g/dL (ref 6.4–8.3)

## 2012-11-01 MED ORDER — TAMOXIFEN CITRATE 20 MG PO TABS: ORAL_TABLET | ORAL | Status: DC

## 2012-11-01 NOTE — Progress Notes (Signed)
OFFICE PROGRESS NOTE  CC  Grace Isaac, MD [address] [place] [address]  DIAGNOSIS:  [name] is a 78 year old [place] woman diagnosis of stage II invasive lobular carcinoma of the right breast diagnosed September 2009  PRIOR THERAPY:  #1 presented with a mass in the upper inner quadrant of the right breast.  She has a very strong family history of breast cancer.  She had a mammogram on 08/31/2007, which showed some architectural distortion lower inner right breast slightly more prominent than before.  Right breast ultrasound showed an area of abnormality at 2 o'clock position 4 cm from the nipple measuring 0.9 x 1.4 x 1.3 cm.  BSGI did show abnormal activity in the right breast corresponding to the palpable abnormality.  Biopsy on 09/12/2007 was performed and showed an invasive mammary carcinoma consistent with an invasive lobular type, ER 71%, PR 68%, HER2 0, Ki67 4%.  A followup MRI scan was done on 10/30/2007 and this showed a nodular parenchymal enhancement of both breasts.  There was a 1.6 x 1.5 x 1.5 cm mass, 3 o'clock position, right breast.  There is a suspicious 2 cm mass in the left lobe of the liver.  A dedicated CT of the abdomen and pelvis was done on 10/30/2007, which showed a 5 mm low-density lesion anterior aspect left lobe of the liver consistent with hemangioma or cyst.     #2The patient underwent a lumpectomy and sentinel lymph node evaluation on 11/12/2007 by Dr. Jamey Ripa with final pathology showed a 1.8 cm grade 1 of 3 invasive ductal cancer with close medial and deep margin along with DCIS.  Additional tissue obtained from the superolateral margin was negative for malignancy.  Two sentinel lymph nodes were obtained, one of which had metastatic carcinoma.  She underwent re-excision of the site on 12/24/07, with final pathology resulting DCIS, low grade, spanning at least 0.35cm with the DCIS 0.1cm to the superior surgical resection margin.    #3  She underwent radiation therapy under the care of Dr. Kathrynn Running from 01/28/08 to 03/07/08.  She was started on Femara in March of 2010 and took it for around 3 years when she was switched to Tamoxifen because of several leg cramps with Femara.    CURRENT THERAPY:  Tamoxifen 20 mg daily.  INTERVAL HISTORY: [name] 78 y.o. female returns for followup today. She is a prior patient of Dr. Pierce Crane. Clinically patient seems to be doing well. She is tolerating tamoxifen without any significant problems. She is denying any fevers chills night sweats. She does have hot flashes. She has no myalgias and arthralgias no peripheral paresthesias. No vaginal bleeding or discharge. Remainder of the 10 point review of systems is negative.  MEDICAL HISTORY: Past Medical History  Diagnosis Date  . Cancer     rt breast  . hx: breast cancer, right UIQ, invasive, receptor + her 2 - 09/12/2007    ALLERGIES:  has No Known Allergies.  MEDICATIONS:  Current Outpatient Prescriptions  Medication Sig Dispense Refill  . calcium carbonate 1250 MG capsule Take 1,250 mg by mouth daily.       . cholecalciferol (VITAMIN D) 1000 UNITS tablet Take 1,000 Units by mouth daily.      . Multiple Vitamin (MULTIVITAMIN) capsule Take 1 capsule by mouth daily.      . tamoxifen (NOLVADEX) 20 MG tablet TAKE 1 TABLET BY MOUTH DAILY.  90 tablet  1   No current facility-administered medications  for this visit.    SURGICAL HISTORY:  Past Surgical History  Procedure Laterality Date  . Breast lumpectomy      x2 right breast    REVIEW OF SYSTEMS:   Constitutional: Feels well.  No fever, chills, vision changes, dizziness.  Cardiovascular: No chest pain, shortness of breath, or edema.  Pulmonary: No cough or wheeze.  Gastrointestinal: No nausea, vomiting, or diarrhea. No bright red blood per rectum or change in bowel movement.  Genitourinary: No frequency, urgency, or dysuria. No vaginal bleeding or discharge.   Musculoskeletal: No myalgia or joint pain. Neurologic: No weakness, numbness, or change in gait.  Psychology: No depression, anxiety, or insomnia.  HEALTH MAINTENANCE: Mammogram: 09/23/11 Colonoscopy: 4 years ago Bone Density: Not had Pap Smear: 4 years ago Eye Exam: 2013  PHYSICAL EXAMINATION: Blood pressure 126/82, pulse 73, temperature 98.5 F (36.9 C), temperature source Oral, resp. rate 18, height 5\' 2"  (1.575 m), weight 169 lb 6.4 oz (76.839 kg), SpO2 98.00%. Body mass index is 30.98 kg/(m^2). ECOG PERFORMANCE STATUS: 0 - Asymptomatic  General: Well developed, well nourished female in no acute distress. Alert and oriented x 3.  Head/ Neck: Oropharynx clear.  Sclerae anicteric.  Supple without any enlargements.  Cyst-like, mobile area to the crown of the head, non-tender, no erythema, or visual skin changes, size of a pea.             Hardened, calcified knot-like area, non tender, palpated right after the beginning of the hairline.  No skin changes visualized. Lymph node survey: No cervical, supraclavicular, or axillary adenopathy  Cardiovascular: Regular rate and rhythm. S1 and S2 normal.  Lungs: Clear to auscultation bilaterally. No wheezes/crackles/rhonchi noted.  Skin: No rashes or lesions present.  Scattered seborrheic keratosis on the upper abdomen and back.   Back: No CVA tenderness.  Abdomen: Abdomen soft, non-tender and obese. Active bowel sounds in all quadrants. No evidence of a fluid wave or abdominal masses.  Breasts: Inspection negative with no nodularity, masses, erythema, or discharge noted bilaterally.  Left nipple inverted (normal finding per patient). Extremities: No bilateral cyanosis, edema, or clubbing.    LABORATORY DATA: Lab Results  Component Value Date   WBC 4.9 11/01/2012   HGB 12.8 11/01/2012   HCT 38.6 11/01/2012   MCV 88.9 11/01/2012   PLT 214 11/01/2012      Chemistry      Component Value Date/Time   NA 141 11/01/2012 1156   NA 141  04/01/2011 1106   K 4.4 11/01/2012 1156   K 4.4 04/01/2011 1106   CL 108* 04/24/2012 1411   CL 106 04/01/2011 1106   CO2 22 11/01/2012 1156   CO2 25 04/01/2011 1106   BUN 14.3 11/01/2012 1156   BUN 15 04/01/2011 1106   CREATININE 0.9 11/01/2012 1156   CREATININE 1.03 04/01/2011 1106      Component Value Date/Time   CALCIUM 9.8 11/01/2012 1156   CALCIUM 9.5 04/01/2011 1106   ALKPHOS 56 11/01/2012 1156   ALKPHOS 91 04/01/2011 1106   AST 30 11/01/2012 1156   AST 23 04/01/2011 1106   ALT 28 11/01/2012 1156   ALT 20 04/01/2011 1106   BILITOT 0.29 11/01/2012 1156   BILITOT 0.3 04/01/2011 1106       RADIOGRAPHIC STUDIES:  No results found.  ASSESSMENT:  78 year old [place] woman:  #1 S/P right lumpectomy with sentinel lymph node biopsy on 11/12/07 by Dr. Jamey Ripa for a T1c N1 IDC with DCIS of the right  breast, grade 1, Stage IIA, ER 71%, PR 68%, Ki67 4%, HER2 0.  #2  She completed radiation therapy in March of 2010.  #3  She took Femara for 3 years then switched to Tamoxifen and has tolerated it well.  Patient will continue tamoxifen for a total of 10 years  PLAN:   #1 patient will continue tamoxifen 20 mg daily.  #2 I will see her back in 6 months time for followup.  All questions were answered. The patient knows to call the clinic with any problems, questions or concerns. We can certainly see the patient much sooner if necessary.   Drue Second, MD Medical/Oncology [facility] [phone] (beeper) [phone] (Office)  11/01/2012, 12:55 PM

## 2012-11-09 ENCOUNTER — Encounter (INDEPENDENT_AMBULATORY_CARE_PROVIDER_SITE_OTHER): Payer: Self-pay | Admitting: Surgery

## 2012-11-09 ENCOUNTER — Ambulatory Visit (INDEPENDENT_AMBULATORY_CARE_PROVIDER_SITE_OTHER): Payer: Medicare Other | Admitting: Surgery

## 2012-11-09 DIAGNOSIS — L7211 Pilar cyst: Secondary | ICD-10-CM

## 2012-11-09 NOTE — Patient Instructions (Signed)
Give me a call if you decide to have the small cysts in your scalp removed

## 2012-11-09 NOTE — Progress Notes (Signed)
The patient came back for me to recheck the 2 cysts in her scalp. They were noted at her routine annual breast cancer followup in January.they have been asymptomatic and she cannot tell what had changed. She wanted me to recheck them in case I thought they should be removed.  On exam she has 2 scalp cysts, both about 1 cm in size, both well circumscribed. One is on the anterior top of her head and the other a little bit more posterior. Neither are tender. There appear unchanged to me from her last visit.  Impression pilarcysts x2 asymptomatic Plan: I discussed removal under local versus simple followup. I told her there is no need to remove these unless they were bothersome. She will therefore defer any surgical intervention.

## 2013-05-08 ENCOUNTER — Telehealth: Payer: Self-pay

## 2013-05-08 NOTE — Telephone Encounter (Signed)
LMOVM - KK out on LOA.  5/14 apptmoved to 6/9 at 2 pm.  Pt to call clinic to reschedule if d/t is inconvenient.

## 2013-05-16 ENCOUNTER — Ambulatory Visit: Payer: Medicare Other | Admitting: Oncology

## 2013-05-16 ENCOUNTER — Other Ambulatory Visit: Payer: Medicare Other

## 2013-06-11 ENCOUNTER — Telehealth: Payer: Self-pay | Admitting: Oncology

## 2013-06-11 ENCOUNTER — Other Ambulatory Visit (HOSPITAL_BASED_OUTPATIENT_CLINIC_OR_DEPARTMENT_OTHER): Payer: Medicare Other

## 2013-06-11 ENCOUNTER — Ambulatory Visit (HOSPITAL_BASED_OUTPATIENT_CLINIC_OR_DEPARTMENT_OTHER): Payer: Medicare Other | Admitting: Oncology

## 2013-06-11 VITALS — BP 132/83 | HR 82 | Temp 98.5°F | Resp 18 | Ht 63.0 in | Wt 168.7 lb

## 2013-06-11 DIAGNOSIS — C50911 Malignant neoplasm of unspecified site of right female breast: Secondary | ICD-10-CM

## 2013-06-11 DIAGNOSIS — C50219 Malignant neoplasm of upper-inner quadrant of unspecified female breast: Secondary | ICD-10-CM

## 2013-06-11 DIAGNOSIS — C50919 Malignant neoplasm of unspecified site of unspecified female breast: Secondary | ICD-10-CM

## 2013-06-11 LAB — CBC WITH DIFFERENTIAL/PLATELET
BASO%: 0.6 % (ref 0.0–2.0)
BASOS ABS: 0 10*3/uL (ref 0.0–0.1)
EOS ABS: 0.1 10*3/uL (ref 0.0–0.5)
EOS%: 1.8 % (ref 0.0–7.0)
HEMATOCRIT: 37.5 % (ref 34.8–46.6)
HEMOGLOBIN: 12.3 g/dL (ref 11.6–15.9)
LYMPH%: 28.5 % (ref 14.0–49.7)
MCH: 29 pg (ref 25.1–34.0)
MCHC: 32.8 g/dL (ref 31.5–36.0)
MCV: 88.7 fL (ref 79.5–101.0)
MONO#: 0.4 10*3/uL (ref 0.1–0.9)
MONO%: 7.5 % (ref 0.0–14.0)
NEUT%: 61.6 % (ref 38.4–76.8)
NEUTROS ABS: 3.4 10*3/uL (ref 1.5–6.5)
PLATELETS: 207 10*3/uL (ref 145–400)
RBC: 4.23 10*6/uL (ref 3.70–5.45)
RDW: 13.1 % (ref 11.2–14.5)
WBC: 5.5 10*3/uL (ref 3.9–10.3)
lymph#: 1.6 10*3/uL (ref 0.9–3.3)

## 2013-06-11 LAB — COMPREHENSIVE METABOLIC PANEL (CC13)
ALBUMIN: 3.4 g/dL — AB (ref 3.5–5.0)
ALT: 30 U/L (ref 0–55)
AST: 29 U/L (ref 5–34)
Alkaline Phosphatase: 59 U/L (ref 40–150)
Anion Gap: 7 mEq/L (ref 3–11)
BUN: 15.2 mg/dL (ref 7.0–26.0)
CO2: 25 mEq/L (ref 22–29)
Calcium: 9.2 mg/dL (ref 8.4–10.4)
Chloride: 109 mEq/L (ref 98–109)
Creatinine: 1 mg/dL (ref 0.6–1.1)
GLUCOSE: 90 mg/dL (ref 70–140)
POTASSIUM: 4.4 meq/L (ref 3.5–5.1)
Sodium: 142 mEq/L (ref 136–145)
Total Bilirubin: 0.25 mg/dL (ref 0.20–1.20)
Total Protein: 6.3 g/dL — ABNORMAL LOW (ref 6.4–8.3)

## 2013-06-11 NOTE — Telephone Encounter (Signed)
pe rpof to sch appt --sch pt mamma 9/24 @10 :30-pt aware-printed pt copy of sch

## 2013-06-12 ENCOUNTER — Encounter: Payer: Self-pay | Admitting: Oncology

## 2013-06-12 NOTE — Progress Notes (Signed)
[name]  [phone] [phone]     ID: [name] OB: June 06, 1934  MR#: [id number]  XKG#:[id number]  Patient Care Team: Lowanda Foster, MD as PCP - General (Internal Medicine) Eston Esters, MD (Hematology and Oncology) Lora Paula, MD (Radiation Oncology) Haywood Lasso, MD (General Surgery)  CHIEF COMPLAINT:  Chief Complaint  Patient presents with  . Breast Cancer  . Follow-up    BREAST CANCER HISTORY: stage II invasive lobular carcinoma of the right breast diagnosed September 2009  PRIOR THERAPY:  #1 presented with a mass in the upper inner quadrant of the right breast.  She has a very strong family history of breast cancer.  She had a mammogram on 08/31/2007, which showed some architectural distortion lower inner right breast slightly more prominent than before.  Right breast ultrasound showed an area of abnormality at 2 o'clock position 4 cm from the nipple measuring 0.9 x 1.4 x 1.3 cm.  BSGI did show abnormal activity in the right breast corresponding to the palpable abnormality.  Biopsy on 09/12/2007 was performed and showed an invasive mammary carcinoma consistent with an invasive lobular type, ER 71%, PR 68%, HER2 0, Ki67 4%.  A followup MRI scan was done on 10/30/2007 and this showed a nodular parenchymal enhancement of both breasts.  There was a 1.6 x 1.5 x 1.5 cm mass, 3 o'clock position, right breast.  There is a suspicious 2 cm mass in the left lobe of the liver.  A dedicated CT of the abdomen and pelvis was done on 10/30/2007, which showed a 5 mm low-density lesion anterior aspect left lobe of the liver consistent with hemangioma or cyst.    #2The patient underwent a lumpectomy and sentinel lymph node evaluation on 11/12/2007 by Dr. Margot Chimes with final pathology showed a 1.8 cm grade 1 of 3 invasive ductal cancer with close medial and deep margin along with DCIS.  Additional tissue obtained from the superolateral  margin was negative for malignancy.  Two sentinel lymph nodes were obtained, one of which had metastatic carcinoma.  She underwent re-excision of the site on 12/24/07, with final pathology resulting DCIS, low grade, spanning at least 0.35cm with the DCIS 0.1cm to the superior surgical resection margin.   #3 She underwent radiation therapy under the care of Dr. Tammi Klippel from 01/28/08 to 03/07/08.  She was started on Femara in March of 2010 and took it for around 3 years when she was switched to Tamoxifen because of several leg cramps with Femara.    INTERVAL HISTORY:  Patient returns to clinic today for routine six-month followup.  She continues to tolerate tamoxifen without significant side effects.  She denies any recent fevers or illnesses.  She has a good appetite and denies weight loss.  She denies any pain.  She denies any chest pain or shortness breath.  She has no nausea, vomiting, constipation, or diarrhea.  She has no urinary complaints.  Patient feels at her baseline and offers no specific complaints today.  REVIEW OF SYSTEMS:  As per HPI. Otherwise, 10 point system review was negative.  PAST MEDICAL HISTORY: Past Medical History  Diagnosis Date  . Cancer     rt breast  . hx: breast cancer, right UIQ, invasive, receptor + her 2 - 09/12/2007    PAST SURGICAL HISTORY: Past Surgical History  Procedure Laterality Date  . Breast lumpectomy      x2 right breast    FAMILY HISTORY Family History  Problem Relation  Age of Onset  . Cancer Mother     breast (76), leukemia (64), colon (70),   . Cancer Father     prostate  . Cancer Sister     breast (21s), colon (72), currently has alzheimers  . Cancer Brother 13    colon, prostate, liver  . Cancer Paternal Grandmother     pancreatic  . Cancer Other 45    breast cancer, BRCA1 VUS and BRCA2 VUS  . Cancer Other 50    breast cancer  . Other Daughter     BRCA2 VUS  . Cancer Cousin 60    breast cancer, BRCA2 varient   . Cancer Cousin  50    breast    GYNECOLOGIC HISTORY:  No LMP recorded.      ADVANCED DIRECTIVES:    HEALTH MAINTENANCE: History  Substance Use Topics  . Smoking status: Former Research scientist (life sciences)  . Smokeless tobacco: Not on file     Comment: quit in 1976  . Alcohol Use: No     Colonoscopy:  PAP:  Bone density:  Lipid panel:  No Known Allergies  Current Outpatient Prescriptions  Medication Sig Dispense Refill  . Multiple Vitamin (MULTIVITAMIN) capsule Take 1 capsule by mouth daily.      Marland Kitchen OVER THE COUNTER MEDICATION Take 1 tablet by mouth daily. Magnesium/zinc/?      . tamoxifen (NOLVADEX) 20 MG tablet TAKE 1 TABLET BY MOUTH DAILY.  90 tablet  4  . calcium carbonate 1250 MG capsule Take 1,250 mg by mouth daily.       . cholecalciferol (VITAMIN D) 1000 UNITS tablet Take 1,000 Units by mouth daily.       No current facility-administered medications for this visit.    OBJECTIVE: Filed Vitals:   06/11/13 1417  BP: 132/83  Pulse: 82  Temp: 98.5 F (36.9 C)  Resp: 18     Body mass index is 29.89 kg/(m^2).    ECOG FS:0 - Asymptomatic  General: Well-developed, well-nourished, no acute distress. Eyes: Pink conjunctiva, anicteric sclera. Breasts: Bilateral breast and axilla without lumps or masses. Lungs: Clear to auscultation bilaterally. Heart: Regular rate and rhythm. No rubs, murmurs, or gallops. Abdomen: Soft, nontender, nondistended. No organomegaly noted, normoactive bowel sounds. Musculoskeletal: No edema, cyanosis, or clubbing. Neuro: Alert, answering all questions appropriately. Cranial nerves grossly intact. Skin: No rashes or petechiae noted. Psych: Normal affect.   LAB RESULTS:  CMP     Component Value Date/Time   NA 142 06/11/2013 1355   NA 141 04/01/2011 1106   K 4.4 06/11/2013 1355   K 4.4 04/01/2011 1106   CL 108* 04/24/2012 1411   CL 106 04/01/2011 1106   CO2 25 06/11/2013 1355   CO2 25 04/01/2011 1106   GLUCOSE 90 06/11/2013 1355   GLUCOSE 91 04/24/2012 1411   GLUCOSE 107*  04/01/2011 1106   BUN 15.2 06/11/2013 1355   BUN 15 04/01/2011 1106   CREATININE 1.0 06/11/2013 1355   CREATININE 1.03 04/01/2011 1106   CALCIUM 9.2 06/11/2013 1355   CALCIUM 9.5 04/01/2011 1106   PROT 6.3* 06/11/2013 1355   PROT 6.2 04/01/2011 1106   ALBUMIN 3.4* 06/11/2013 1355   ALBUMIN 3.8 04/01/2011 1106   AST 29 06/11/2013 1355   AST 23 04/01/2011 1106   ALT 30 06/11/2013 1355   ALT 20 04/01/2011 1106   ALKPHOS 59 06/11/2013 1355   ALKPHOS 91 04/01/2011 1106   BILITOT 0.25 06/11/2013 1355   BILITOT 0.3 04/01/2011 1106   GFRNONAA >60  12/24/2007 0705   GFRAA  Value: >60        The eGFR has been calculated using the MDRD equation. This calculation has not been validated in all clinical 12/24/2007 0705    I No results found for this basename: SPEP, UPEP,  kappa and lambda light chains    Lab Results  Component Value Date   WBC 5.5 06/11/2013   NEUTROABS 3.4 06/11/2013   HGB 12.3 06/11/2013   HCT 37.5 06/11/2013   MCV 88.7 06/11/2013   PLT 207 06/11/2013      Chemistry      Component Value Date/Time   NA 142 06/11/2013 1355   NA 141 04/01/2011 1106   K 4.4 06/11/2013 1355   K 4.4 04/01/2011 1106   CL 108* 04/24/2012 1411   CL 106 04/01/2011 1106   CO2 25 06/11/2013 1355   CO2 25 04/01/2011 1106   BUN 15.2 06/11/2013 1355   BUN 15 04/01/2011 1106   CREATININE 1.0 06/11/2013 1355   CREATININE 1.03 04/01/2011 1106      Component Value Date/Time   CALCIUM 9.2 06/11/2013 1355   CALCIUM 9.5 04/01/2011 1106   ALKPHOS 59 06/11/2013 1355   ALKPHOS 91 04/01/2011 1106   AST 29 06/11/2013 1355   AST 23 04/01/2011 1106   ALT 30 06/11/2013 1355   ALT 20 04/01/2011 1106   BILITOT 0.25 06/11/2013 1355   BILITOT 0.3 04/01/2011 1106       Lab Results  Component Value Date   LABCA2 18 04/01/2011    No components found with this basename: [id number]    No results found for this basename: INR,  in the last 168 hours  Urinalysis    Component Value Date/Time   COLORURINE YELLOW 12/24/2007 0700   APPEARANCEUR CLOUDY* 12/24/2007  0700   LABSPEC 1.020 12/24/2007 0700   PHURINE 5.5 12/24/2007 0700   GLUCOSEU NEGATIVE 12/24/2007 0700   HGBUR NEGATIVE 12/24/2007 0700   BILIRUBINUR NEGATIVE 12/24/2007 0700   KETONESUR NEGATIVE 12/24/2007 0700   PROTEINUR NEGATIVE 12/24/2007 0700   UROBILINOGEN 0.2 12/24/2007 0700   NITRITE NEGATIVE 12/24/2007 0700   LEUKOCYTESUR MODERATE* 12/24/2007 0700    STUDIES: No results found.  ASSESSMENT:    #1 S/P right lumpectomy with sentinel lymph node biopsy on 11/12/07 by Dr. Margot Chimes for a T1c N1 IDC with DCIS of the right breast, grade 1, Stage IIA, ER 71%, PR 68%, Ki67 4%, HER2 0.  #2  She completed radiation therapy in March of 2010.  #3  She took Femara for 3 years then switched to Tamoxifen and has tolerated it well.  Patient will continue tamoxifen for a total of 10 years  PLAN:    1.  Breast cancer: No evidence of disease.  Her most recent mammogram on September 24, 2012 was reported as BI-RADS 2.  Repeat in one year.  Continue tamoxifen, the initial plan was to complete a total of 10 years of therapy but patient may consider stopping after 5 years.  Return to clinic in 6 months for routine evaluation.  Patient expressed understanding and was in agreement with this plan.   Lloyd Huger, MD   06/12/2013 3:09 PM

## 2013-11-07 ENCOUNTER — Other Ambulatory Visit: Payer: Self-pay | Admitting: Oncology

## 2013-11-07 DIAGNOSIS — C50911 Malignant neoplasm of unspecified site of right female breast: Secondary | ICD-10-CM

## 2013-11-07 NOTE — Telephone Encounter (Signed)
[facility] Pharmacist called.  Patient there asking for refill.  One authorized to cover until next visit at [facility] with new provider.

## 2013-12-10 ENCOUNTER — Ambulatory Visit: Payer: Medicare Other | Admitting: Hematology and Oncology

## 2013-12-10 ENCOUNTER — Other Ambulatory Visit: Payer: Self-pay | Admitting: *Deleted

## 2013-12-10 ENCOUNTER — Other Ambulatory Visit: Payer: Medicare Other

## 2013-12-10 DIAGNOSIS — C50919 Malignant neoplasm of unspecified site of unspecified female breast: Secondary | ICD-10-CM

## 2013-12-12 ENCOUNTER — Telehealth: Payer: Self-pay | Admitting: *Deleted

## 2013-12-12 NOTE — Telephone Encounter (Signed)
Left message to follow up on missed appointment.  

## 2013-12-13 ENCOUNTER — Telehealth: Payer: Self-pay | Admitting: Hematology and Oncology

## 2013-12-13 NOTE — Telephone Encounter (Signed)
returned pt call re a missed appt. per pt she was unaware. this was a 22mo f/u given to pt back in june 2015. pt given new appt for 01/16/14 lb/VG.

## 2014-01-06 ENCOUNTER — Telehealth: Payer: Self-pay | Admitting: Hematology and Oncology

## 2014-01-06 NOTE — Telephone Encounter (Signed)
, °

## 2014-01-16 ENCOUNTER — Telehealth: Payer: Self-pay | Admitting: Hematology and Oncology

## 2014-01-16 ENCOUNTER — Ambulatory Visit (HOSPITAL_BASED_OUTPATIENT_CLINIC_OR_DEPARTMENT_OTHER): Payer: Medicare Other | Admitting: Hematology and Oncology

## 2014-01-16 ENCOUNTER — Other Ambulatory Visit (HOSPITAL_BASED_OUTPATIENT_CLINIC_OR_DEPARTMENT_OTHER): Payer: Medicare Other

## 2014-01-16 VITALS — BP 130/68 | HR 73 | Temp 99.1°F | Resp 18 | Ht 63.0 in | Wt 168.9 lb

## 2014-01-16 DIAGNOSIS — C50919 Malignant neoplasm of unspecified site of unspecified female breast: Secondary | ICD-10-CM

## 2014-01-16 DIAGNOSIS — C50211 Malignant neoplasm of upper-inner quadrant of right female breast: Secondary | ICD-10-CM

## 2014-01-16 DIAGNOSIS — Z171 Estrogen receptor negative status [ER-]: Secondary | ICD-10-CM

## 2014-01-16 LAB — COMPREHENSIVE METABOLIC PANEL (CC13)
ALT: 26 U/L (ref 0–55)
AST: 26 U/L (ref 5–34)
Albumin: 3.5 g/dL (ref 3.5–5.0)
Alkaline Phosphatase: 65 U/L (ref 40–150)
Anion Gap: 7 mEq/L (ref 3–11)
BUN: 11.2 mg/dL (ref 7.0–26.0)
CO2: 26 mEq/L (ref 22–29)
Calcium: 9.2 mg/dL (ref 8.4–10.4)
Chloride: 108 mEq/L (ref 98–109)
Creatinine: 0.9 mg/dL (ref 0.6–1.1)
EGFR: 61 mL/min/{1.73_m2} — AB (ref >90)
Glucose: 88 mg/dl (ref 70–140)
Potassium: 4.2 mEq/L (ref 3.5–5.1)
SODIUM: 141 meq/L (ref 136–145)
TOTAL PROTEIN: 6.3 g/dL — AB (ref 6.4–8.3)
Total Bilirubin: 0.24 mg/dL (ref 0.20–1.20)

## 2014-01-16 LAB — CBC WITH DIFFERENTIAL/PLATELET
BASO%: 0.2 % (ref 0.0–2.0)
BASOS ABS: 0 10*3/uL (ref 0.0–0.1)
EOS ABS: 0.1 10*3/uL (ref 0.0–0.5)
EOS%: 1.3 % (ref 0.0–7.0)
HEMATOCRIT: 39.4 % (ref 34.8–46.6)
HGB: 13.1 g/dL (ref 11.6–15.9)
LYMPH%: 32.5 % (ref 14.0–49.7)
MCH: 29.4 pg (ref 25.1–34.0)
MCHC: 33.2 g/dL (ref 31.5–36.0)
MCV: 88.3 fL (ref 79.5–101.0)
MONO#: 0.4 10*3/uL (ref 0.1–0.9)
MONO%: 7.7 % (ref 0.0–14.0)
NEUT#: 3.1 10*3/uL (ref 1.5–6.5)
NEUT%: 58.3 % (ref 38.4–76.8)
PLATELETS: 204 10*3/uL (ref 145–400)
RBC: 4.46 10*6/uL (ref 3.70–5.45)
RDW: 12.9 % (ref 11.2–14.5)
WBC: 5.3 10*3/uL (ref 3.9–10.3)
lymph#: 1.7 10*3/uL (ref 0.9–3.3)

## 2014-01-16 MED ORDER — LETROZOLE 2.5 MG PO TABS: 2.5000 mg | ORAL_TABLET | Freq: Every day | ORAL | Status: DC

## 2014-01-16 NOTE — Progress Notes (Signed)
Patient Care Team: Lowanda Foster, MD as PCP - General (Internal Medicine) Eston Esters, MD (Hematology and Oncology) Lora Paula, MD (Radiation Oncology) Haywood Lasso, MD (General Surgery)  DIAGNOSIS: Breast cancer of upper-inner quadrant of right female breast   Staging form: Breast, AJCC 6th Edition     Clinical: T1c - Unsigned       Prognostic indicators: ER 71%  PR 68%  Ki67 4%  Her 2 Negative      Pathologic: Stage IIA (T1, N1, M0) - Signed by Haywood Lasso, MD on 12/24/2010       Prognostic indicators: ER 71%  PR 68%  Ki67 4%  Her 2 Negative    SUMMARY OF ONCOLOGIC HISTORY:   Breast cancer of upper-inner quadrant of right female breast   09/12/2007 Initial Diagnosis Breast cancer of upper-inner quadrant of right female breast   11/12/2007 Surgery Right breast infected and sentinel lymph node study: T1 cN1 M0 invasive ductal carcinoma with DCIS grade 1, stage IIA, ESR 71%, PR 68%, case sent for percent, HER-2 negative   02/11/2008 - 03/16/2008 Radiation Therapy Completed radiation therapy   04/16/2008 -  Anti-estrogen oral therapy Femara for 3 years with tamoxifen for muscle aches and pains and now switching back to Femara because patient does not believe the muscle aches and pains of AI related    CHIEF COMPLIANT: Follow-up of breast cancer  INTERVAL HISTORY: [name] is a 80 year old lady with above-mentioned history of right-sided breast cancer treated with lumpectomy and radiation currently on antiestrogen therapy initially with Femara switched to tamoxifen and now she wants to switch back to Femara. She had musculoskeletal aches and pains that resulted in switching from Femara to tamoxifen. But her symptoms did not improve at all. She is able to manage her life without any major problems. She would like to switch back to Femara because it is a superior drug when compared to tamoxifen. She denies any new lumps or nodules in the breasts.  REVIEW OF  SYSTEMS:   Constitutional: Denies fevers, chills or abnormal weight loss Eyes: Denies blurriness of vision Ears, nose, mouth, throat, and face: Denies mucositis or sore throat Respiratory: Denies cough, dyspnea or wheezes Cardiovascular: Denies palpitation, chest discomfort or lower extremity swelling Gastrointestinal:  Denies nausea, heartburn or change in bowel habits Skin: Denies abnormal skin rashes Lymphatics: Denies new lymphadenopathy or easy bruising Neurological:Denies numbness, tingling or new weaknesses Behavioral/Psych: Mood is stable, no new changes  Breast:  denies any pain or lumps or nodules in either breasts All other systems were reviewed with the patient and are negative.  I have reviewed the past medical history, past surgical history, social history and family history with the patient and they are unchanged from previous note.  ALLERGIES:  has No Known Allergies.  MEDICATIONS:  Current Outpatient Prescriptions  Medication Sig Dispense Refill  . calcium carbonate 1250 MG capsule Take 1,250 mg by mouth daily.     . cholecalciferol (VITAMIN D) 1000 UNITS tablet Take 1,000 Units by mouth daily.    . Multiple Vitamin (MULTIVITAMIN) capsule Take 1 capsule by mouth daily.    Marland Kitchen OVER THE COUNTER MEDICATION Take 1 tablet by mouth daily. Magnesium/zinc/?    . letrozole (FEMARA) 2.5 MG tablet Take 1 tablet (2.5 mg total) by mouth daily. 90 tablet 3   No current facility-administered medications for this visit.    PHYSICAL EXAMINATION: ECOG PERFORMANCE STATUS: 1 - Symptomatic but completely ambulatory  Filed Vitals:  01/16/14 1414  BP: 130/68  Pulse: 73  Temp: 99.1 F (37.3 C)  Resp: 18   Filed Weights   01/16/14 1414  Weight: 168 lb 14.4 oz (76.613 kg)    GENERAL:alert, no distress and comfortable SKIN: skin color, texture, turgor are normal, no rashes or significant lesions EYES: normal, Conjunctiva are pink and non-injected, sclera clear OROPHARYNX:no  exudate, no erythema and lips, buccal mucosa, and tongue normal  NECK: supple, thyroid normal size, non-tender, without nodularity LYMPH:  no palpable lymphadenopathy in the cervical, axillary or inguinal LUNGS: clear to auscultation and percussion with normal breathing effort HEART: regular rate & rhythm and no murmurs and no lower extremity edema ABDOMEN:abdomen soft, non-tender and normal bowel sounds Musculoskeletal:no cyanosis of digits and no clubbing  NEURO: alert & oriented x 3 with fluent speech, no focal motor/sensory deficits BREAST: No palpable masses or nodules in either right or left breasts. No palpable axillary supraclavicular or infraclavicular adenopathy no breast tenderness or nipple discharge.   LABORATORY DATA:  I have reviewed the data as listed   Chemistry      Component Value Date/Time   NA 141 01/16/2014 1343   NA 141 04/01/2011 1106   K 4.2 01/16/2014 1343   K 4.4 04/01/2011 1106   CL 108* 04/24/2012 1411   CL 106 04/01/2011 1106   CO2 26 01/16/2014 1343   CO2 25 04/01/2011 1106   BUN 11.2 01/16/2014 1343   BUN 15 04/01/2011 1106   CREATININE 0.9 01/16/2014 1343   CREATININE 1.03 04/01/2011 1106      Component Value Date/Time   CALCIUM 9.2 01/16/2014 1343   CALCIUM 9.5 04/01/2011 1106   ALKPHOS 65 01/16/2014 1343   ALKPHOS 91 04/01/2011 1106   AST 26 01/16/2014 1343   AST 23 04/01/2011 1106   ALT 26 01/16/2014 1343   ALT 20 04/01/2011 1106   BILITOT 0.24 01/16/2014 1343   BILITOT 0.3 04/01/2011 1106       Lab Results  Component Value Date   WBC 5.3 01/16/2014   HGB 13.1 01/16/2014   HCT 39.4 01/16/2014   MCV 88.3 01/16/2014   PLT 204 01/16/2014   NEUTROABS 3.1 01/16/2014     RADIOGRAPHIC STUDIES: I have personally reviewed the radiology reports and agreed with their findings. Mammogram September 2015 normal  ASSESSMENT & PLAN:  Breast cancer of upper-inner quadrant of right female breast Right breast invasive ductal carcinoma  diagnosed 09/12/2007 status post lumpectomy 11/12/2007 T1 cN1 M0 with DCIS grade 1 stage II A, ER/PR positive HER-2 negative casts sent for percent status post radiation therapy took Femara for 3 years restricted tamoxifen for muscle skeletal pains wants to switch back to Femara currently plan of treatment is for 10 years  Antiestrogen toxicities: Muscle aches and pains Breast cancer surveillance: 1. Mammogram since September 2015 were apparently normally do not have the results from [facility] 2. Breast exam done 01/16/2014 is normal  Return to clinic in 1 year for follow-up    Orders Placed This Encounter  Procedures  . CBC with Differential    Standing Status: Future     Number of Occurrences:      Standing Expiration Date: 01/16/2015  . Comprehensive metabolic panel (Cmet) - [facility]    Standing Status: Future     Number of Occurrences:      Standing Expiration Date: 01/16/2015   The patient has a good understanding of the overall plan. she agrees with it. She will call with any problems that  may develop before her next visit here.   Rulon Eisenmenger, MD

## 2014-01-16 NOTE — Assessment & Plan Note (Signed)
Right breast invasive ductal carcinoma diagnosed 09/12/2007 status post lumpectomy 11/12/2007 T1 cN1 M0 with DCIS grade 1 stage II A, ER/PR positive HER-2 negative casts sent for percent status post radiation therapy took Femara for 3 years restricted tamoxifen for muscle skeletal pains wants to switch back to Femara currently plan of treatment is for 10 years  Antiestrogen toxicities: Muscle aches and pains Breast cancer surveillance: 1. Mammogram since September 2015 were apparently normally do not have the results from [facility] 2. Breast exam done 01/16/2014 is normal  Return to clinic in 1 year for follow-up

## 2014-01-16 NOTE — Telephone Encounter (Signed)
[facility] PT APPT SCHEDULE FOR JAN 2017 °

## 2015-01-14 NOTE — Assessment & Plan Note (Signed)
Right breast invasive ductal carcinoma diagnosed 09/12/2007 status post lumpectomy 11/12/2007 T1 cN1 M0 with DCIS grade 1 stage II A, ER/PR positive HER-2 negative casts sent for percent status post radiation therapy took Femara for 3 years restricted tamoxifen for muscle skeletal pains wants to switch back to Femara currently plan of treatment is for 10 years  Antiestrogen toxicities: Muscle aches and pains Breast cancer surveillance: 1. Mammogram since September 2016 were apparently normally do not have the results from [facility] 2. Breast exam done 01/15/2015 is normal  Return to clinic in 1 year for follow-up

## 2015-01-15 ENCOUNTER — Encounter: Payer: Self-pay | Admitting: Hematology and Oncology

## 2015-01-15 ENCOUNTER — Telehealth: Payer: Self-pay | Admitting: Hematology and Oncology

## 2015-01-15 ENCOUNTER — Other Ambulatory Visit (HOSPITAL_BASED_OUTPATIENT_CLINIC_OR_DEPARTMENT_OTHER): Payer: Medicare Other

## 2015-01-15 ENCOUNTER — Ambulatory Visit (HOSPITAL_BASED_OUTPATIENT_CLINIC_OR_DEPARTMENT_OTHER): Payer: Medicare Other | Admitting: Hematology and Oncology

## 2015-01-15 VITALS — BP 136/71 | HR 79 | Temp 98.9°F | Resp 18 | Wt 166.1 lb

## 2015-01-15 DIAGNOSIS — C50211 Malignant neoplasm of upper-inner quadrant of right female breast: Secondary | ICD-10-CM

## 2015-01-15 DIAGNOSIS — C50919 Malignant neoplasm of unspecified site of unspecified female breast: Secondary | ICD-10-CM

## 2015-01-15 LAB — CBC WITH DIFFERENTIAL/PLATELET
BASO%: 0.6 % (ref 0.0–2.0)
BASOS ABS: 0 10*3/uL (ref 0.0–0.1)
EOS ABS: 0.1 10*3/uL (ref 0.0–0.5)
EOS%: 2.1 % (ref 0.0–7.0)
HEMATOCRIT: 38.2 % (ref 34.8–46.6)
HGB: 12.8 g/dL (ref 11.6–15.9)
LYMPH%: 32.8 % (ref 14.0–49.7)
MCH: 29.4 pg (ref 25.1–34.0)
MCHC: 33.5 g/dL (ref 31.5–36.0)
MCV: 87.6 fL (ref 79.5–101.0)
MONO#: 0.4 10*3/uL (ref 0.1–0.9)
MONO%: 8.4 % (ref 0.0–14.0)
NEUT%: 56.1 % (ref 38.4–76.8)
NEUTROS ABS: 2.9 10*3/uL (ref 1.5–6.5)
Platelets: 215 10*3/uL (ref 145–400)
RBC: 4.36 10*6/uL (ref 3.70–5.45)
RDW: 13.4 % (ref 11.2–14.5)
WBC: 5.1 10*3/uL (ref 3.9–10.3)
lymph#: 1.7 10*3/uL (ref 0.9–3.3)

## 2015-01-15 LAB — COMPREHENSIVE METABOLIC PANEL
ALK PHOS: 90 U/L (ref 40–150)
ALT: 20 U/L (ref 0–55)
AST: 26 U/L (ref 5–34)
Albumin: 3.7 g/dL (ref 3.5–5.0)
Anion Gap: 8 mEq/L (ref 3–11)
BUN: 16.3 mg/dL (ref 7.0–26.0)
CALCIUM: 9.5 mg/dL (ref 8.4–10.4)
CO2: 25 mEq/L (ref 22–29)
CREATININE: 1 mg/dL (ref 0.6–1.1)
Chloride: 108 mEq/L (ref 98–109)
EGFR: 56 mL/min/{1.73_m2} — ABNORMAL LOW (ref >90)
Glucose: 91 mg/dl (ref 70–140)
POTASSIUM: 4.3 meq/L (ref 3.5–5.1)
Sodium: 141 mEq/L (ref 136–145)
TOTAL PROTEIN: 6.7 g/dL (ref 6.4–8.3)

## 2015-01-15 MED ORDER — TOLTERODINE TARTRATE ER 2 MG PO CP24: 2.0000 mg | ORAL_CAPSULE | Freq: Every day | ORAL | Status: DC

## 2015-01-15 MED ORDER — LETROZOLE 2.5 MG PO TABS: 2.5000 mg | ORAL_TABLET | Freq: Every day | ORAL | Status: DC

## 2015-01-15 NOTE — Telephone Encounter (Signed)
Appointments made and avs printed for pateint °

## 2015-01-15 NOTE — Progress Notes (Signed)
Patient Care Team: Lowanda Foster, MD as PCP - General (Internal Medicine) Eston Esters, MD (Hematology and Oncology) Tyler Pita, MD (Radiation Oncology) Neldon Mc, MD (General Surgery)  DIAGNOSIS: Breast cancer of upper-inner quadrant of right female breast [facility])   Staging form: Breast, AJCC 6th Edition     Clinical: T1c - Unsigned       Prognostic indicators: ER 71%  PR 68%  Ki67 4%  Her 2 Negative     Pathologic: Stage IIA (T1, N1, M0) - Signed by Haywood Lasso, MD on 12/24/2010       Prognostic indicators: ER 71%  PR 68% Ki67 4%  Her 2 Negative  SUMMARY OF ONCOLOGIC HISTORY:   Breast cancer of upper-inner quadrant of right female breast ([place])   09/12/2007 Initial Diagnosis Breast cancer of upper-inner quadrant of right female breast   11/12/2007 Surgery Right breast infected and sentinel lymph node study: T1 cN1 M0 invasive ductal carcinoma with DCIS grade 1, stage IIA, ESR 71%, PR 68%, case sent for percent, HER-2 negative   02/11/2008 - 03/16/2008 Radiation Therapy Completed radiation therapy   04/16/2008 -  Anti-estrogen oral therapy Femara for 3 years with tamoxifen for muscle aches and pains and now switching back to Femara because patient does not believe the muscle aches and pains of AI related    CHIEF COMPLIANT: follow-up on Femara  INTERVAL HISTORY: [name] is a 81 year old with above-mentioned history right breast cancer currently on adjuvant antiestrogen therapy. She has been on it for the past 6 and half years and appears to be tolerating it generally well. She complains of some achiness in the right lower extremity otherwise doing quite well. Denies any lumps or nodules in the breasts. She had a mammogram September 2016 which was normal.  REVIEW OF SYSTEMS:   Constitutional: Denies fevers, chills or abnormal weight loss Eyes: Denies blurriness of vision Ears, nose, mouth, throat, and face: Denies mucositis or sore throat Respiratory:  Denies cough, dyspnea or wheezes Cardiovascular: Denies palpitation, chest discomfort Gastrointestinal:  Denies nausea, heartburn or change in bowel habits Skin: Denies abnormal skin rashes Lymphatics: Denies new lymphadenopathy or easy bruising Neurological:Denies numbness, tingling or new weaknesses Behavioral/Psych: Mood is stable, no new changes  Extremities: muscle aches and pains especially in the right lower extremity Breast:  denies any pain or lumps or nodules in either breasts All other systems were reviewed with the patient and are negative.  I have reviewed the past medical history, past surgical history, social history and family history with the patient and they are unchanged from previous note.  ALLERGIES:  has No Known Allergies.  MEDICATIONS:  Current Outpatient Prescriptions  Medication Sig Dispense Refill  . calcium carbonate 1250 MG capsule Take 1,250 mg by mouth daily.     . cholecalciferol (VITAMIN D) 1000 UNITS tablet Take 1,000 Units by mouth daily.    Marland Kitchen letrozole (FEMARA) 2.5 MG tablet Take 1 tablet (2.5 mg total) by mouth daily. 90 tablet 3  . Multiple Vitamin (MULTIVITAMIN) capsule Take 1 capsule by mouth daily.    Marland Kitchen OVER THE COUNTER MEDICATION Take 1 tablet by mouth daily. Magnesium/zinc/?     No current facility-administered medications for this visit.    PHYSICAL EXAMINATION: ECOG PERFORMANCE STATUS: 1 - Symptomatic but completely ambulatory  Filed Vitals:   01/15/15 1414  BP: 136/71  Pulse: 79  Temp: 98.9 F (37.2 C)  Resp: 18   Filed Weights   01/15/15 1414  Weight: 166  lb 1.6 oz (75.342 kg)    GENERAL:alert, no distress and comfortable SKIN: skin color, texture, turgor are normal, no rashes or significant lesions EYES: normal, Conjunctiva are pink and non-injected, sclera clear OROPHARYNX:no exudate, no erythema and lips, buccal mucosa, and tongue normal  NECK: supple, thyroid normal size, non-tender, without nodularity LYMPH:  no  palpable lymphadenopathy in the cervical, axillary or inguinal LUNGS: clear to auscultation and percussion with normal breathing effort HEART: regular rate & rhythm and no murmurs and no lower extremity edema ABDOMEN:abdomen soft, non-tender and normal bowel sounds MUSCULOSKELETAL:no cyanosis of digits and no clubbing  NEURO: alert & oriented x 3 with fluent speech, no focal motor/sensory deficits EXTREMITIES: No lower extremity edema BREAST:yes she asked for a DO NOT RESUSCITATE order patient is a left-sided DO NOT RESUSCITATE I displayed a small cyst( patient that he is No palpable masses or nodules in either right or left breasts. No palpable axillary supraclavicular or infraclavicular adenopathy no breast tenderness or nipple discharge. (exam performed in the presence of a chaperone)  LABORATORY DATA:  I have reviewed the data as listed   Chemistry      Component Value Date/Time   NA 141 01/15/2015 1355   NA 141 04/01/2011 1106   K 4.3 01/15/2015 1355   K 4.4 04/01/2011 1106   CL 108* 04/24/2012 1411   CL 106 04/01/2011 1106   CO2 25 01/15/2015 1355   CO2 25 04/01/2011 1106   BUN 16.3 01/15/2015 1355   BUN 15 04/01/2011 1106   CREATININE 1.0 01/15/2015 1355   CREATININE 1.03 04/01/2011 1106      Component Value Date/Time   CALCIUM 9.5 01/15/2015 1355   CALCIUM 9.5 04/01/2011 1106   ALKPHOS 90 01/15/2015 1355   ALKPHOS 91 04/01/2011 1106   AST 26 01/15/2015 1355   AST 23 04/01/2011 1106   ALT 20 01/15/2015 1355   ALT 20 04/01/2011 1106   BILITOT <0.30 01/15/2015 1355   BILITOT 0.3 04/01/2011 1106       Lab Results  Component Value Date   WBC 5.1 01/15/2015   HGB 12.8 01/15/2015   HCT 38.2 01/15/2015   MCV 87.6 01/15/2015   PLT 215 01/15/2015   NEUTROABS 2.9 01/15/2015   ASSESSMENT & PLAN:  Breast cancer of upper-inner quadrant of right female breast Right breast invasive ductal carcinoma diagnosed 09/12/2007 status post lumpectomy 11/12/2007 T1 cN1 M0 with DCIS  grade 1 stage II A, ER/PR positive HER-2 negative casts sent for percent status post radiation therapy took Femara for 3 years restricted tamoxifen for muscle skeletal pains wants to switch back to Femara currently plan of treatment is for 10 years  Antiestrogen toxicities: Muscle aches and pains Breast cancer surveillance: 1. Mammogram September 2016 were  normal. at [facility], breast density category B 2. Breast exam done 01/15/2015 is normal  Return to clinic in 1 year for follow-up   No orders of the defined types were placed in this encounter.   The patient has a good understanding of the overall plan. she agrees with it. she will call with any problems that may develop before the next visit here.   Rulon Eisenmenger, MD 01/15/2015

## 2015-01-15 NOTE — Addendum Note (Signed)
Addended by: Prentiss Bells on: 01/15/2015 05:58 PM   Modules accepted: Medications

## 2015-02-24 ENCOUNTER — Encounter: Payer: Self-pay | Admitting: Hematology and Oncology

## 2015-05-18 DIAGNOSIS — Z0000 Encounter for general adult medical examination without abnormal findings: Secondary | ICD-10-CM | POA: Diagnosis not present

## 2015-09-10 DIAGNOSIS — L821 Other seborrheic keratosis: Secondary | ICD-10-CM | POA: Diagnosis not present

## 2015-09-10 DIAGNOSIS — L570 Actinic keratosis: Secondary | ICD-10-CM | POA: Diagnosis not present

## 2015-09-10 DIAGNOSIS — L7211 Pilar cyst: Secondary | ICD-10-CM | POA: Diagnosis not present

## 2015-09-10 DIAGNOSIS — D2230 Melanocytic nevi of unspecified part of face: Secondary | ICD-10-CM | POA: Diagnosis not present

## 2015-10-01 DIAGNOSIS — Z1231 Encounter for screening mammogram for malignant neoplasm of breast: Secondary | ICD-10-CM | POA: Diagnosis not present

## 2015-10-01 DIAGNOSIS — Z853 Personal history of malignant neoplasm of breast: Secondary | ICD-10-CM | POA: Diagnosis not present

## 2015-11-09 DIAGNOSIS — Z23 Encounter for immunization: Secondary | ICD-10-CM | POA: Diagnosis not present

## 2015-12-15 DIAGNOSIS — Z23 Encounter for immunization: Secondary | ICD-10-CM | POA: Diagnosis not present

## 2015-12-15 DIAGNOSIS — L7211 Pilar cyst: Secondary | ICD-10-CM | POA: Diagnosis not present

## 2015-12-15 DIAGNOSIS — L570 Actinic keratosis: Secondary | ICD-10-CM | POA: Diagnosis not present

## 2016-01-18 NOTE — Assessment & Plan Note (Deleted)
Right breast invasive ductal carcinoma diagnosed 09/12/2007 status post lumpectomy 11/12/2007 T1 cN1 M0 with DCIS grade 1 stage II A, ER/PR positive HER-2 negative Ki 67: 4% status post radiation therapy took Femara for 3 years changed to tamoxifen for muscle skeletal pains switched back to Femara currently plan of treatment is for 10 years  Antiestrogen toxicities: Muscle aches and pains Breast cancer surveillance: 1. Mammogram September 2016 were  normal. at [facility], breast density category B 2. Breast exam done 01/19/2016 is normal  Return to clinic in 1 year for follow-up

## 2016-01-19 ENCOUNTER — Other Ambulatory Visit: Payer: Medicare Other

## 2016-01-19 ENCOUNTER — Ambulatory Visit: Payer: Medicare Other | Admitting: Hematology and Oncology

## 2016-01-19 NOTE — Progress Notes (Deleted)
Patient Care Team: Lowanda Foster, MD as PCP - General (Internal Medicine) Eston Esters, MD (Inactive) (Hematology and Oncology) Tyler Pita, MD (Radiation Oncology) Neldon Mc, MD (General Surgery)  DIAGNOSIS:  Encounter Diagnosis  Name Primary?  . Malignant neoplasm of upper-inner quadrant of right breast in female, estrogen receptor positive ([place])     SUMMARY OF ONCOLOGIC HISTORY:   Breast cancer of upper-inner quadrant of right female breast ([place])   09/12/2007 Initial Diagnosis    Breast cancer of upper-inner quadrant of right female breast      11/12/2007 Surgery    Right breast infected and sentinel lymph node study: T1 cN1 M0 invasive ductal carcinoma with DCIS grade 1, stage IIA, ER 71%, PR 68%, Ki 67: 7%, HER-2 negative      02/11/2008 - 03/16/2008 Radiation Therapy    Completed radiation therapy      04/16/2008 -  Anti-estrogen oral therapy    Femara for 3 years then tamoxifen again to Femara believe the muscle aches and pains of AI related       CHIEF COMPLIANT: follow-up on Femara  INTERVAL HISTORY: [name] is a 82 year old with above-mentioned history of right breast cancer treated with lumpectomy followed by radiation and is now on antiestrogen therapy since 04/06/2008. She is tolerating Femara fairly well. Does not have any hot flashes or myalgias. REVIEW OF SYSTEMS:   Constitutional: Denies fevers, chills or abnormal weight loss Eyes: Denies blurriness of vision Ears, nose, mouth, throat, and face: Denies mucositis or sore throat Respiratory: Denies cough, dyspnea or wheezes Cardiovascular: Denies palpitation, chest discomfort Gastrointestinal:  Denies nausea, heartburn or change in bowel habits Skin: Denies abnormal skin rashes Lymphatics: Denies new lymphadenopathy or easy bruising Neurological:Denies numbness, tingling or new weaknesses Behavioral/Psych: Mood is stable, no new changes  Extremities: No lower extremity  edema Breast:  denies any pain or lumps or nodules in either breasts All other systems were reviewed with the patient and are negative.  I have reviewed the past medical history, past surgical history, social history and family history with the patient and they are unchanged from previous note.  ALLERGIES:  has No Known Allergies.  MEDICATIONS:  Current Outpatient Prescriptions  Medication Sig Dispense Refill  . calcium carbonate 1250 MG capsule Take 1,250 mg by mouth daily.     . cholecalciferol (VITAMIN D) 1000 UNITS tablet Take 1,000 Units by mouth daily.    Marland Kitchen letrozole (FEMARA) 2.5 MG tablet Take 1 tablet (2.5 mg total) by mouth daily. 90 tablet 3  . Multiple Vitamin (MULTIVITAMIN) capsule Take 1 capsule by mouth daily.    Marland Kitchen OVER THE COUNTER MEDICATION Take 1 tablet by mouth daily. Magnesium/zinc/?    . tolterodine (DETROL LA) 2 MG 24 hr capsule Take 1 capsule (2 mg total) by mouth daily. 30 capsule 11   No current facility-administered medications for this visit.     PHYSICAL EXAMINATION: ECOG PERFORMANCE STATUS: 1 - Symptomatic but completely ambulatory  There were no vitals filed for this visit. There were no vitals filed for this visit.  GENERAL:alert, no distress and comfortable SKIN: skin color, texture, turgor are normal, no rashes or significant lesions EYES: normal, Conjunctiva are pink and non-injected, sclera clear OROPHARYNX:no exudate, no erythema and lips, buccal mucosa, and tongue normal  NECK: supple, thyroid normal size, non-tender, without nodularity LYMPH:  no palpable lymphadenopathy in the cervical, axillary or inguinal LUNGS: clear to auscultation and percussion with normal breathing effort HEART: regular rate & rhythm and  no murmurs and no lower extremity edema ABDOMEN:abdomen soft, non-tender and normal bowel sounds MUSCULOSKELETAL:no cyanosis of digits and no clubbing  NEURO: alert & oriented x 3 with fluent speech, no focal motor/sensory  deficits EXTREMITIES: No lower extremity edema BREAST: No palpable masses or nodules in either right or left breasts. No palpable axillary supraclavicular or infraclavicular adenopathy no breast tenderness or nipple discharge. (exam performed in the presence of a chaperone)  LABORATORY DATA:  I have reviewed the data as listed   Chemistry      Component Value Date/Time   NA 141 01/15/2015 1355   K 4.3 01/15/2015 1355   CL 108 (H) 04/24/2012 1411   CO2 25 01/15/2015 1355   BUN 16.3 01/15/2015 1355   CREATININE 1.0 01/15/2015 1355      Component Value Date/Time   CALCIUM 9.5 01/15/2015 1355   ALKPHOS 90 01/15/2015 1355   AST 26 01/15/2015 1355   ALT 20 01/15/2015 1355   BILITOT <0.30 01/15/2015 1355       Lab Results  Component Value Date   WBC 5.1 01/15/2015   HGB 12.8 01/15/2015   HCT 38.2 01/15/2015   MCV 87.6 01/15/2015   PLT 215 01/15/2015   NEUTROABS 2.9 01/15/2015    ASSESSMENT & PLAN:  Breast cancer of upper-inner quadrant of right female breast Right breast invasive ductal carcinoma diagnosed 09/12/2007 status post lumpectomy 11/12/2007 T1 cN1 M0 with DCIS grade 1 stage II A, ER/PR positive HER-2 negative Ki 67: 4% status post radiation therapy took Femara for 3 years changed to tamoxifen for muscle skeletal pains switched back to Femara currently plan of treatment is for 10 years  Antiestrogen toxicities: Muscle aches and pains Breast cancer surveillance: 1. Mammogram September 2016 were  normal. at [facility], breast density category B 2. Breast exam done 01/19/2016 is normal  Return to clinic in 1 year for follow-up   I spent 25 minutes talking to the patient of which more than half was spent in counseling and coordination of care.  No orders of the defined types were placed in this encounter.  The patient has a good understanding of the overall plan. she agrees with it. she will call with any problems that may develop before the next visit here.   Rulon Eisenmenger, MD 01/19/16

## 2016-02-24 ENCOUNTER — Other Ambulatory Visit: Payer: Self-pay | Admitting: Hematology and Oncology

## 2016-02-24 DIAGNOSIS — C50919 Malignant neoplasm of unspecified site of unspecified female breast: Secondary | ICD-10-CM

## 2016-02-25 ENCOUNTER — Other Ambulatory Visit: Payer: Self-pay

## 2016-02-25 ENCOUNTER — Telehealth: Payer: Self-pay | Admitting: Hematology and Oncology

## 2016-02-25 MED ORDER — LETROZOLE 2.5 MG PO TABS: 2.5000 mg | ORAL_TABLET | Freq: Every day | ORAL | 3 refills | Status: DC

## 2016-02-25 NOTE — Telephone Encounter (Signed)
Patient called today re needing refill on prescription and an appointment. Rescheduled missed lab/fu from January and transferred patient to desk nurse re prescription. Patient given new appointment for lab/fu 2/26 @ 2:30 pm.

## 2016-02-26 ENCOUNTER — Other Ambulatory Visit: Payer: Self-pay

## 2016-02-26 DIAGNOSIS — C50211 Malignant neoplasm of upper-inner quadrant of right female breast: Secondary | ICD-10-CM

## 2016-02-26 DIAGNOSIS — Z170 Estrogen receptor positive status [ER+]: Principal | ICD-10-CM

## 2016-02-29 ENCOUNTER — Other Ambulatory Visit (HOSPITAL_BASED_OUTPATIENT_CLINIC_OR_DEPARTMENT_OTHER): Payer: Medicare Other

## 2016-02-29 ENCOUNTER — Encounter: Payer: Self-pay | Admitting: Hematology and Oncology

## 2016-02-29 ENCOUNTER — Ambulatory Visit (HOSPITAL_BASED_OUTPATIENT_CLINIC_OR_DEPARTMENT_OTHER): Payer: Medicare Other | Admitting: Hematology and Oncology

## 2016-02-29 DIAGNOSIS — Z79811 Long term (current) use of aromatase inhibitors: Secondary | ICD-10-CM | POA: Diagnosis not present

## 2016-02-29 DIAGNOSIS — C50211 Malignant neoplasm of upper-inner quadrant of right female breast: Secondary | ICD-10-CM

## 2016-02-29 DIAGNOSIS — Z170 Estrogen receptor positive status [ER+]: Secondary | ICD-10-CM

## 2016-02-29 LAB — COMPREHENSIVE METABOLIC PANEL
ALBUMIN: 3.8 g/dL (ref 3.5–5.0)
ALK PHOS: 98 U/L (ref 40–150)
ALT: 18 U/L (ref 0–55)
AST: 22 U/L (ref 5–34)
Anion Gap: 5 mEq/L (ref 3–11)
BILIRUBIN TOTAL: 0.25 mg/dL (ref 0.20–1.20)
BUN: 12.6 mg/dL (ref 7.0–26.0)
CALCIUM: 9.8 mg/dL (ref 8.4–10.4)
CO2: 27 mEq/L (ref 22–29)
Chloride: 108 mEq/L (ref 98–109)
Creatinine: 1 mg/dL (ref 0.6–1.1)
EGFR: 53 mL/min/{1.73_m2} — AB (ref >90)
GLUCOSE: 94 mg/dL (ref 70–140)
Potassium: 4.2 mEq/L (ref 3.5–5.1)
SODIUM: 140 meq/L (ref 136–145)
TOTAL PROTEIN: 6.7 g/dL (ref 6.4–8.3)

## 2016-02-29 LAB — CBC WITH DIFFERENTIAL/PLATELET
BASO%: 0.4 % (ref 0.0–2.0)
Basophils Absolute: 0 10*3/uL (ref 0.0–0.1)
EOS ABS: 0.1 10*3/uL (ref 0.0–0.5)
EOS%: 1.8 % (ref 0.0–7.0)
HEMATOCRIT: 38.7 % (ref 34.8–46.6)
HEMOGLOBIN: 12.8 g/dL (ref 11.6–15.9)
LYMPH#: 1.6 10*3/uL (ref 0.9–3.3)
LYMPH%: 31.1 % (ref 14.0–49.7)
MCH: 29 pg (ref 25.1–34.0)
MCHC: 33.1 g/dL (ref 31.5–36.0)
MCV: 87.6 fL (ref 79.5–101.0)
MONO#: 0.4 10*3/uL (ref 0.1–0.9)
MONO%: 8 % (ref 0.0–14.0)
NEUT%: 58.7 % (ref 38.4–76.8)
NEUTROS ABS: 3 10*3/uL (ref 1.5–6.5)
Platelets: 218 10*3/uL (ref 145–400)
RBC: 4.42 10*6/uL (ref 3.70–5.45)
RDW: 13.4 % (ref 11.2–14.5)
WBC: 5.1 10*3/uL (ref 3.9–10.3)

## 2016-02-29 MED ORDER — LETROZOLE 2.5 MG PO TABS: 2.5000 mg | ORAL_TABLET | Freq: Every day | ORAL | 3 refills | Status: DC

## 2016-02-29 NOTE — Progress Notes (Signed)
Patient Care Team: Lowanda Foster, MD as PCP - General (Internal Medicine) Eston Esters, MD (Inactive) (Hematology and Oncology) Tyler Pita, MD (Radiation Oncology) Neldon Mc, MD (General Surgery)  DIAGNOSIS:  Encounter Diagnosis  Name Primary?  . Malignant neoplasm of upper-inner quadrant of right breast in female, estrogen receptor positive ([place])     SUMMARY OF ONCOLOGIC HISTORY:   Breast cancer of upper-inner quadrant of right female breast ([place])   09/12/2007 Initial Diagnosis    Breast cancer of upper-inner quadrant of right female breast      11/12/2007 Surgery    Right breast infected and sentinel lymph node study: T1 cN1 M0 invasive ductal carcinoma with DCIS grade 1, stage IIA, ER 71%, PR 68%, Ki 67: 7%, HER-2 negative      02/11/2008 - 03/16/2008 Radiation Therapy    Completed radiation therapy      04/16/2008 -  Anti-estrogen oral therapy    Femara for 3 years then tamoxifen again to Femara believe the muscle aches and pains of AI related      CHIEF COMPLIANT: Follow-up on letrozole  INTERVAL HISTORY: [name] is a 82 year old with above-mentioned history of right breast cancer currently on letrozole therapy. She is tolerating it extremely well. She's been on it for the past almost 8 years. She reports that she is tolerating it fairly well. She does have occasional muscle aches and pains and some fatigue. 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  REVIEW OF SYSTEMS:   Constitutional: Denies fevers, chills or abnormal weight loss Eyes: Denies blurriness of vision Ears, nose, mouth, throat, and face: Denies mucositis or sore throat Respiratory: Denies cough, dyspnea or wheezes Cardiovascular: Denies palpitation, chest discomfort Gastrointestinal:  Denies nausea, heartburn or change in bowel habits Skin: Denies abnormal skin rashes Lymphatics: Denies new lymphadenopathy or easy bruising Neurological:Denies  numbness, tingling or new weaknesses Behavioral/Psych: Mood is stable, no new changes  Extremities: No lower extremity edema Breast: 3 lesions denies any pain or lumps or nodules in either breasts All other systems were reviewed with the patient and are negative.  I have reviewed the past medical history, past surgical history, social history and family history with the patient and they are unchanged from previous note.  ALLERGIES:  has No Known Allergies.  MEDICATIONS:  Current Outpatient Prescriptions  Medication Sig Dispense Refill  . calcium carbonate 1250 MG capsule Take 1,250 mg by mouth daily.     . cholecalciferol (VITAMIN D) 1000 UNITS tablet Take 1,000 Units by mouth daily.    Marland Kitchen letrozole (FEMARA) 2.5 MG tablet Take 1 tablet (2.5 mg total) by mouth daily. 90 tablet 3  . Multiple Vitamin (MULTIVITAMIN) capsule Take 1 capsule by mouth daily.    Marland Kitchen OVER THE COUNTER MEDICATION Take 1 tablet by mouth daily. Magnesium/zinc/?    . tolterodine (DETROL LA) 2 MG 24 hr capsule Take 1 capsule (2 mg total) by mouth daily. 30 capsule 11   No current facility-administered medications for this visit.     PHYSICAL EXAMINATION: ECOG PERFORMANCE STATUS: 1 - Symptomatic but completely ambulatory  Vitals:   02/29/16 1453  BP: 123/73  Pulse: 73  Resp: 18  Temp: 98.2 F (36.8 C)   Filed Weights   02/29/16 1453  Weight: 161 lb 9.6 oz (73.3 kg)    GENERAL:alert, no distress and comfortable SKIN: skin color, texture, turgor are normal, no rashes or significant lesions EYES: normal, Conjunctiva are pink and non-injected, sclera clear OROPHARYNX:no  exudate, no erythema and lips, buccal mucosa, and tongue normal  NECK: supple, thyroid normal size, non-tender, without nodularity LYMPH:  no palpable lymphadenopathy in the cervical, axillary or inguinal LUNGS: clear to auscultation and percussion with normal breathing effort HEART: regular rate & rhythm and no murmurs and no lower extremity  edema ABDOMEN:abdomen soft, non-tender and normal bowel sounds MUSCULOSKELETAL:no cyanosis of digits and no clubbing  NEURO: alert & oriented x 3 with fluent speech, no focal motor/sensory deficits EXTREMITIES: No lower extremity edema BREAST: No palpable masses or nodules in either right or left breasts.Left nipple is inverted. No palpable axillary supraclavicular or infraclavicular adenopathy no breast tenderness or nipple discharge. (exam performed in the presence of a chaperone)  LABORATORY DATA:  I have reviewed the data as listed   Chemistry      Component Value Date/Time   NA 141 01/15/2015 1355   K 4.3 01/15/2015 1355   CL 108 (H) 04/24/2012 1411   CO2 25 01/15/2015 1355   BUN 16.3 01/15/2015 1355   CREATININE 1.0 01/15/2015 1355      Component Value Date/Time   CALCIUM 9.5 01/15/2015 1355   ALKPHOS 90 01/15/2015 1355   AST 26 01/15/2015 1355   ALT 20 01/15/2015 1355   BILITOT <0.30 01/15/2015 1355       Lab Results  Component Value Date   WBC 5.1 02/29/2016   HGB 12.8 02/29/2016   HCT 38.7 02/29/2016   MCV 87.6 02/29/2016   PLT 218 02/29/2016   NEUTROABS 3.0 02/29/2016    ASSESSMENT & PLAN:  Breast cancer of upper-inner quadrant of right female breast Right breast invasive ductal carcinoma diagnosed 09/12/2007 status post lumpectomy 11/12/2007 T1 cN1 M0 with DCIS grade 1 stage II A, ER/PR positive HER-2 negative casts sent for percent status post radiation therapy took Femara for 3 years restricted tamoxifen for muscle skeletal pains wants to switch back to Femara currently plan of treatment is for 10 years  Antiestrogen toxicities: Muscle aches and pains Breast cancer surveillance: 1. Mammogram September 2017 were  normal. at [facility], breast density category B 2. Breast exam done 02/29/2016 is normal  Patient will return 82 later this year She stays extremely busy managing rental portfolio.  Return to clinic in 1 year for follow-up   I spent 25 minutes  talking to the patient of which more than half was spent in counseling and coordination of care.  No orders of the defined types were placed in this encounter.  The patient has a good understanding of the overall plan. she agrees with it. she will call with any problems that may develop before the next visit here.   Rulon Eisenmenger, MD 02/29/16

## 2016-02-29 NOTE — Assessment & Plan Note (Signed)
Right breast invasive ductal carcinoma diagnosed 09/12/2007 status post lumpectomy 11/12/2007 T1 cN1 M0 with DCIS grade 1 stage II A, ER/PR positive HER-2 negative casts sent for percent status post radiation therapy took Femara for 3 years restricted tamoxifen for muscle skeletal pains wants to switch back to Femara currently plan of treatment is for 10 years  Antiestrogen toxicities: Muscle aches and pains Breast cancer surveillance: 1. Mammogram September 2017 were  normal. at [facility], breast density category B 2. Breast exam done 02/29/2016 is normal  Return to clinic in 1 year for follow-up

## 2016-10-25 DIAGNOSIS — Z23 Encounter for immunization: Secondary | ICD-10-CM | POA: Diagnosis not present

## 2016-10-26 DIAGNOSIS — M859 Disorder of bone density and structure, unspecified: Secondary | ICD-10-CM | POA: Diagnosis not present

## 2016-10-26 DIAGNOSIS — Z1231 Encounter for screening mammogram for malignant neoplasm of breast: Secondary | ICD-10-CM | POA: Diagnosis not present

## 2016-10-26 DIAGNOSIS — Z853 Personal history of malignant neoplasm of breast: Secondary | ICD-10-CM | POA: Diagnosis not present

## 2017-02-28 ENCOUNTER — Telehealth: Payer: Self-pay | Admitting: Hematology and Oncology

## 2017-02-28 ENCOUNTER — Inpatient Hospital Stay: Payer: Medicare Other | Attending: Hematology and Oncology | Admitting: Hematology and Oncology

## 2017-02-28 ENCOUNTER — Telehealth: Payer: Self-pay

## 2017-02-28 DIAGNOSIS — C50211 Malignant neoplasm of upper-inner quadrant of right female breast: Secondary | ICD-10-CM

## 2017-02-28 DIAGNOSIS — Z853 Personal history of malignant neoplasm of breast: Secondary | ICD-10-CM

## 2017-02-28 DIAGNOSIS — Z170 Estrogen receptor positive status [ER+]: Secondary | ICD-10-CM

## 2017-02-28 NOTE — Assessment & Plan Note (Addendum)
Right breast invasive ductal carcinoma diagnosed 09/12/2007 status post lumpectomy 11/12/2007 T1 cN1 M0 with DCIS grade 1 stage II A, ER/PR positive HER-2 negative casts sent for percent status post radiation therapy took Femara for 3 years switched to tamoxifen for muscle skeletal pains, went back to Femara currently plan of treatment is for 10 years  Antiestrogen toxicities: Muscle aches and pains She will complete 10 years of antiestrogen therapy at the end of this year. I recommended that she discontinue the treatment at that time.  Breast cancer surveillance: 1. Mammogram September 2018 were normal. at [facility], breast density category B 2. Breast exam done 02/28/2017 is normal  She stays extremely busy managing rental portfolio.  Return to clinic in 1 year for follow-up

## 2017-02-28 NOTE — Telephone Encounter (Signed)
Scheduled appt per 2/26 los - Gave patient AVS and calender per los.  

## 2017-02-28 NOTE — Progress Notes (Signed)
Patient Care Team: Lowanda Foster, MD as PCP - General (Internal Medicine) Eston Esters, MD (Inactive) (Hematology and Oncology) Tyler Pita, MD (Radiation Oncology) Neldon Mc, MD (General Surgery)  DIAGNOSIS:  Encounter Diagnosis  Name Primary?  . Malignant neoplasm of upper-inner quadrant of right breast in female, estrogen receptor positive ([place])     SUMMARY OF ONCOLOGIC HISTORY:   Breast cancer of upper-inner quadrant of right female breast ([place])   09/12/2007 Initial Diagnosis    Breast cancer of upper-inner quadrant of right female breast      11/12/2007 Surgery    Right breast infected and sentinel lymph node study: T1 cN1 M0 invasive ductal carcinoma with DCIS grade 1, stage IIA, ER 71%, PR 68%, Ki 67: 7%, HER-2 negative      02/11/2008 - 03/16/2008 Radiation Therapy    Completed radiation therapy      04/16/2008 - 02/29/2016 Anti-estrogen oral therapy    Femara for 3 years then tamoxifen again to Femara believe the muscle aches and pains of AI related       CHIEF COMPLIANT: Surveillance of breast cancer  INTERVAL HISTORY: [name] is a 83-year-old with above-mentioned history of right breast cancer treated with surgery radiation and took antiestrogen therapy for 8 years.  She just continued it last year because she was having muscle and joint pains.  It appears that the muscle and joint pains did not fully recover even after stopping antiestrogen therapy.  She denies any lumps or nodules or pain in the breast.  REVIEW OF SYSTEMS:   Constitutional: Denies fevers, chills or abnormal weight loss Eyes: Denies blurriness of vision Ears, nose, mouth, throat, and face: Denies mucositis or sore throat Respiratory: Denies cough, dyspnea or wheezes Cardiovascular: Denies palpitation, chest discomfort Gastrointestinal:  Denies nausea, heartburn or change in bowel habits Skin: Denies abnormal skin rashes Lymphatics: Denies new lymphadenopathy or easy  bruising Neurological:Denies numbness, tingling or new weaknesses Behavioral/Psych: Mood is stable, no new changes  Extremities: No lower extremity edema Breast:  denies any pain or lumps or nodules in either breasts All other systems were reviewed with the patient and are negative.  I have reviewed the past medical history, past surgical history, social history and family history with the patient and they are unchanged from previous note.  ALLERGIES:  has No Known Allergies.  MEDICATIONS:  Current Outpatient Medications  Medication Sig Dispense Refill  . calcium carbonate 1250 MG capsule Take 1,250 mg by mouth daily.     . Multiple Vitamin (MULTIVITAMIN) capsule Take 1 capsule by mouth daily.    Marland Kitchen tolterodine (DETROL LA) 2 MG 24 hr capsule Take 1 capsule (2 mg total) by mouth daily. 30 capsule 11   No current facility-administered medications for this visit.     PHYSICAL EXAMINATION: ECOG PERFORMANCE STATUS: 1 - Symptomatic but completely ambulatory  Vitals:   02/28/17 1336  BP: 136/74  Pulse: 85  Resp: 16  Temp: 98.3 F (36.8 C)  SpO2: 98%   Filed Weights   02/28/17 1336  Weight: 157 lb 8 oz (71.4 kg)    GENERAL:alert, no distress and comfortable SKIN: skin color, texture, turgor are normal, no rashes or significant lesions EYES: normal, Conjunctiva are pink and non-injected, sclera clear OROPHARYNX:no exudate, no erythema and lips, buccal mucosa, and tongue normal  NECK: supple, thyroid normal size, non-tender, without nodularity LYMPH:  no palpable lymphadenopathy in the cervical, axillary or inguinal LUNGS: clear to auscultation and percussion with normal breathing effort HEART:  regular rate & rhythm and no murmurs and no lower extremity edema ABDOMEN:abdomen soft, non-tender and normal bowel sounds MUSCULOSKELETAL:no cyanosis of digits and no clubbing  NEURO: alert & oriented x 3 with fluent speech, no focal motor/sensory deficits EXTREMITIES: No lower extremity  edema BREAST: No palpable masses or nodules in either right or left breasts. No palpable axillary supraclavicular or infraclavicular adenopathy no breast tenderness or nipple discharge. (exam performed in the presence of a chaperone)  LABORATORY DATA:  I have reviewed the data as listed CMP Latest Ref Rng & Units 02/29/2016 01/15/2015 01/16/2014  Glucose 70 - 140 mg/dl 94 91 88  BUN 7.0 - 26.0 mg/dL 12.6 16.3 11.2  Creatinine 0.6 - 1.1 mg/dL 1.0 1.0 0.9  Sodium 136 - 145 mEq/L 140 141 141  Potassium 3.5 - 5.1 mEq/L 4.2 4.3 4.2  Chloride 98 - 107 mEq/L - - -  CO2 22 - 29 mEq/L 27 25 26   Calcium 8.4 - 10.4 mg/dL 9.8 9.5 9.2  Total Protein 6.4 - 8.3 g/dL 6.7 6.7 6.3(L)  Total Bilirubin 0.20 - 1.20 mg/dL 0.25 <0.30 0.24  Alkaline Phos 40 - 150 U/L 98 90 65  AST 5 - 34 U/L 22 26 26   ALT 0 - 55 U/L 18 20 26     Lab Results  Component Value Date   WBC 5.1 02/29/2016   HGB 12.8 02/29/2016   HCT 38.7 02/29/2016   MCV 87.6 02/29/2016   PLT 218 02/29/2016   NEUTROABS 3.0 02/29/2016    ASSESSMENT & PLAN:  Breast cancer of upper-inner quadrant of right female breast Right breast invasive ductal carcinoma diagnosed 09/12/2007 status post lumpectomy 11/12/2007 T1 cN1 M0 with DCIS grade 1 stage II A, ER/PR positive HER-2 negative casts sent for percent status post radiation therapy took Femara for 3 years switched to tamoxifen for muscle skeletal pains, to Femara until February 2018 stopped because of joint and muscle pain  Breast cancer surveillance: 1. Mammogram September 2018 were normal. at [facility], breast density category B 2. Breast exam done 02/28/2017 is normal  She stays extremely busy managing rental portfolio.  Return to clinic in 1 year for follow-up and after that she can be followed by her primary care physician.   I spent 25 minutes talking to the patient of which more than half was spent in counseling and coordination of care.  No orders of the defined types were placed in  this encounter.  The patient has a good understanding of the overall plan. she agrees with it. she will call with any problems that may develop before the next visit here.   Harriette Ohara, MD 02/28/17

## 2017-02-28 NOTE — Telephone Encounter (Signed)
Returned patient call to verify that she do have a scheduled appointment for today at 1:45 pm. Per 2/26 phone message return calls.

## 2017-10-18 DIAGNOSIS — R350 Frequency of micturition: Secondary | ICD-10-CM | POA: Diagnosis not present

## 2017-10-18 DIAGNOSIS — Z23 Encounter for immunization: Secondary | ICD-10-CM | POA: Diagnosis not present

## 2017-10-27 ENCOUNTER — Encounter: Payer: Self-pay | Admitting: Hematology and Oncology

## 2017-10-27 DIAGNOSIS — Z780 Asymptomatic menopausal state: Secondary | ICD-10-CM | POA: Diagnosis not present

## 2017-10-27 DIAGNOSIS — Z1231 Encounter for screening mammogram for malignant neoplasm of breast: Secondary | ICD-10-CM | POA: Diagnosis not present

## 2017-10-27 DIAGNOSIS — Z23 Encounter for immunization: Secondary | ICD-10-CM | POA: Diagnosis not present

## 2017-10-27 DIAGNOSIS — C50211 Malignant neoplasm of upper-inner quadrant of right female breast: Secondary | ICD-10-CM | POA: Diagnosis not present

## 2017-10-27 DIAGNOSIS — Z13220 Encounter for screening for lipoid disorders: Secondary | ICD-10-CM | POA: Diagnosis not present

## 2017-10-27 DIAGNOSIS — Z853 Personal history of malignant neoplasm of breast: Secondary | ICD-10-CM | POA: Diagnosis not present

## 2017-10-27 DIAGNOSIS — R319 Hematuria, unspecified: Secondary | ICD-10-CM | POA: Diagnosis not present

## 2017-10-27 DIAGNOSIS — Z0000 Encounter for general adult medical examination without abnormal findings: Secondary | ICD-10-CM | POA: Diagnosis not present

## 2017-10-27 LAB — HEPATIC FUNCTION PANEL
ALT: 28 (ref 7–35)
AST: 24 (ref 13–35)

## 2017-10-27 LAB — CBC AND DIFFERENTIAL
HCT: 40 (ref 36–46)
Hemoglobin: 13.3 (ref 12.0–16.0)
Platelets: 231 (ref 150–399)
WBC: 4.3

## 2017-10-27 LAB — LIPID PANEL
Cholesterol: 242 — AB (ref 0–200)
HDL: 91 — AB (ref 35–70)
LDL Cholesterol: 141
Triglycerides: 52 (ref 40–160)

## 2017-10-27 LAB — BASIC METABOLIC PANEL
BUN: 15 (ref 4–21)
Glucose: 101
Potassium: 5 (ref 3.4–5.3)
Sodium: 145 (ref 137–147)

## 2017-11-06 DIAGNOSIS — N3941 Urge incontinence: Secondary | ICD-10-CM | POA: Diagnosis not present

## 2017-11-06 DIAGNOSIS — R300 Dysuria: Secondary | ICD-10-CM | POA: Diagnosis not present

## 2017-11-06 DIAGNOSIS — R358 Other polyuria: Secondary | ICD-10-CM | POA: Diagnosis not present

## 2017-11-06 DIAGNOSIS — R339 Retention of urine, unspecified: Secondary | ICD-10-CM | POA: Diagnosis not present

## 2017-11-16 DIAGNOSIS — N3592 Unspecified urethral stricture, female: Secondary | ICD-10-CM | POA: Diagnosis not present

## 2017-11-16 DIAGNOSIS — R300 Dysuria: Secondary | ICD-10-CM | POA: Diagnosis not present

## 2017-11-16 DIAGNOSIS — R3129 Other microscopic hematuria: Secondary | ICD-10-CM | POA: Diagnosis not present

## 2018-01-18 ENCOUNTER — Ambulatory Visit: Payer: Self-pay | Admitting: Nurse Practitioner

## 2018-01-19 ENCOUNTER — Ambulatory Visit: Payer: Self-pay | Admitting: Family

## 2018-02-28 ENCOUNTER — Inpatient Hospital Stay: Payer: Medicare Other | Attending: Hematology and Oncology | Admitting: Hematology and Oncology

## 2018-02-28 NOTE — Assessment & Plan Note (Signed)
Right breast invasive ductal carcinoma diagnosed 09/12/2007 status post lumpectomy 11/12/2007 T1 cN1 M0 with DCIS grade 1 stage II A, ER/PR positive HER-2 negative casts sent for percent status post radiation therapy took Femara for 3 years switched to tamoxifen for muscle skeletal pains, to Femara until February 2018 stopped because of joint and muscle pain  Breast cancer surveillance: 1. Mammogram September 2019were normal. at [facility], breast density category B 2. Breast exam done02/26/2020is normal  She stays extremely busy managing rental portfolio.  I discussed with her that she could be followed by her primary care physician or bilateral long-term survivorship breast practitioner.

## 2018-03-20 ENCOUNTER — Ambulatory Visit: Payer: Self-pay | Admitting: Family

## 2018-04-05 ENCOUNTER — Ambulatory Visit: Payer: Self-pay | Admitting: Nurse Practitioner

## 2018-06-07 ENCOUNTER — Ambulatory Visit: Payer: Self-pay | Admitting: Nurse Practitioner

## 2018-06-25 ENCOUNTER — Encounter: Payer: Self-pay | Admitting: Nurse Practitioner

## 2018-06-26 ENCOUNTER — Other Ambulatory Visit: Payer: Self-pay

## 2018-06-26 ENCOUNTER — Ambulatory Visit (INDEPENDENT_AMBULATORY_CARE_PROVIDER_SITE_OTHER): Payer: Medicare Other | Admitting: Nurse Practitioner

## 2018-06-26 ENCOUNTER — Encounter: Payer: Self-pay | Admitting: Nurse Practitioner

## 2018-06-26 ENCOUNTER — Encounter (INDEPENDENT_AMBULATORY_CARE_PROVIDER_SITE_OTHER): Payer: Self-pay

## 2018-06-26 ENCOUNTER — Telehealth: Payer: Self-pay | Admitting: *Deleted

## 2018-06-26 VITALS — BP 138/80 | HR 79 | Temp 98.2°F | Resp 18 | Ht 61.0 in | Wt 152.8 lb

## 2018-06-26 DIAGNOSIS — N3281 Overactive bladder: Secondary | ICD-10-CM | POA: Diagnosis not present

## 2018-06-26 DIAGNOSIS — E782 Mixed hyperlipidemia: Secondary | ICD-10-CM | POA: Diagnosis not present

## 2018-06-26 DIAGNOSIS — R413 Other amnesia: Secondary | ICD-10-CM

## 2018-06-26 NOTE — Progress Notes (Signed)
Careteam: Patient Care Team: [name], NP as PCP - General (Geriatric Medicine) Eston Esters, MD (Inactive) (Hematology and Oncology) Tyler Pita, MD (Radiation Oncology) Neldon Mc, MD (General Surgery)  Advanced Directive information Does Patient Have a Medical Advance Directive?: Yes, Type of Advance Directive: [place];Living will;Out of facility DNR (pink MOST or yellow form), Pre-existing out of facility DNR order (yellow form or pink MOST form): Yellow form placed in chart (order not valid for inpatient use)  No Known Allergies  Chief Complaint  Patient presents with  . Establish Care    New Patient establish care, Failed Clock     HPI: Patient is a 84 y.o. female seen in the office today to establish care. Strong family hx of cancer. Previously at [facility] but granddaughter wanted her to be someone who is specialized in aging.   Hx of breath cancer- pt  following routinely with oncologist.   States recently had shingles vaccine- granddaughter will get record.  Pt has a partner/roommate (relationship for 20 years) who has children that have been in her house more recently. And things have been going missing. Feels like partners children are stealing from her.  Having more stress/anxiety. Has been working up until recently, she has now turned things over to a Secondary school teacher. Her boyfriend/parter has taken over some of the things around the house and maintenance  to her properties.  Family has noticed some lack of organization.   Review of Systems:  Review of Systems  Constitutional: Negative for chills, fever and weight loss.  HENT: Negative for hearing loss and tinnitus.   Respiratory: Negative for cough, sputum production and shortness of breath.   Cardiovascular: Negative for chest pain, palpitations and leg swelling.  Gastrointestinal: Negative for abdominal pain, constipation, diarrhea and heartburn.  Genitourinary:  Positive for frequency. Negative for dysuria and urgency.  Musculoskeletal: Negative for back pain, falls, joint pain and myalgias.  Skin: Negative.   Neurological: Negative for dizziness and headaches.  Psychiatric/Behavioral: Positive for memory loss. Negative for depression. The patient is nervous/anxious and has insomnia.     Past Medical History:  Diagnosis Date  . Cancer ([place])    rt breast  . hx: breast cancer, right UIQ, invasive, receptor + her 2 - 09/12/2007   Past Surgical History:  Procedure Laterality Date  . BREAST LUMPECTOMY     x2 right breast  . CHOLECYSTECTOMY     Per [place] New Patient Packet    Social History:   reports that she quit smoking about 44 years ago. She has a 10.00 pack-year smoking history. She has never used smokeless tobacco. She reports that she does not drink alcohol or use drugs.  Family History  Problem Relation Age of Onset  . Cancer Mother        breast (76), leukemia (64), colon (43),   . Arthritis Mother   . Osteoporosis Mother   . Cancer Father        prostate  . Dementia Father   . Cancer Sister        breast (87s), colon (72), currently has alzheimers  . Dementia Sister   . Cancer Brother 41       colon, prostate, liver  . Cancer Paternal Grandmother        pancreatic  . Cancer Other 45       breast cancer, BRCA1 VUS and BRCA2 VUS  . Cancer Other 50       breast cancer  .  Other Daughter        BRCA2 VUS  . Cancer Cousin 60       breast cancer, BRCA2 varient   . Cancer Cousin 50       breast    Medications: Patient's Medications   No medications on file    Physical Exam:  Vitals:   06/26/18 0923  BP: 138/80  Pulse: 79  Resp: 18  Temp: 98.2 F (36.8 C)  TempSrc: Oral  SpO2: 95%  Weight: 152 lb 12.8 oz (69.3 kg)  Height: _0  (1.549 m)   Body mass index is 28.87 kg/m. Wt Readings from Last 3 Encounters:  06/26/18 152 lb 12.8 oz (69.3 kg)  02/28/17 157 lb 8 oz (71.4 kg)  02/29/16 161 lb 9.6 oz (73.3 kg)     Physical Exam Constitutional:      General: She is not in acute distress.    Appearance: She is well-developed. She is not diaphoretic.  HENT:     Head: Normocephalic and atraumatic.     Mouth/Throat:     Pharynx: No oropharyngeal exudate.  Eyes:     Conjunctiva/sclera: Conjunctivae normal.     Pupils: Pupils are equal, round, and reactive to light.  Neck:     Musculoskeletal: Normal range of motion and neck supple.  Cardiovascular:     Rate and Rhythm: Normal rate and regular rhythm.     Heart sounds: Normal heart sounds.  Pulmonary:     Effort: Pulmonary effort is normal.     Breath sounds: Normal breath sounds.  Abdominal:     General: Bowel sounds are normal.     Palpations: Abdomen is soft.  Musculoskeletal:        General: No tenderness.  Skin:    General: Skin is warm and dry.  Neurological:     Mental Status: She is alert and oriented to person, place, and time.  Psychiatric:        Mood and Affect: Affect is flat.        Behavior: Behavior is slowed.     Comments: MMSE 26/30     Labs reviewed: Basic Metabolic Panel: No results for input(s): NA, K, CL, CO2, GLUCOSE, BUN, CREATININE, CALCIUM, MG, PHOS, TSH in the last 8760 hours. Liver Function Tests: No results for input(s): AST, ALT, ALKPHOS, BILITOT, PROT, ALBUMIN in the last 8760 hours. No results for input(s): LIPASE, AMYLASE in the last 8760 hours. No results for input(s): AMMONIA in the last 8760 hours. CBC: No results for input(s): WBC, NEUTROABS, HGB, HCT, MCV, PLT in the last 8760 hours. Lipid Panel: No results for input(s): CHOL, HDL, LDLCALC, TRIG, CHOLHDL, LDLDIRECT in the last 8760 hours. TSH: No results for input(s): TSH in the last 8760 hours. A1C: No results found for: HGBA1C   Assessment/Plan 1. Mixed hyperlipidemia -reports poor diet, encouraged diet modifications and exercise.  - Lipid panel; Future  2. Memory loss -granddaughter concerned over memory loss, however appears pt has  been stressed with increase anxiety, she feels like she is managing this. She has completed HPOA and financial POA. -will get labs and CT of head. If needed she seemed agreeable to aricept.  - COMPLETE METABOLIC PANEL WITH GFR; Future - CBC with Differential/Platelet; Future - TSH; Future - Vitamin B12; Future - CT Head Wo Contrast; Future - RPR; Future  3. OAB (overactive bladder) Chronic frequency of urination noted without acute change.   Next appt: 06/28/2018 for lab work.   3 month follow up  for routine visit.  Carlos American. [place], [place] [facility] [phone]

## 2018-06-26 NOTE — Telephone Encounter (Signed)
Thanks for the update. Patient's granddaughter also spoke with Judson Roch (front desk registration) and relayed the same information prior to bringing patient into office

## 2018-06-26 NOTE — Patient Instructions (Signed)
Increase physical activity, 30 mins of walking 3 days a week would be beneficial to physical and mental health   DASH Eating Plan DASH stands for "Dietary Approaches to Stop Hypertension." The DASH eating plan is a healthy eating plan that has been shown to reduce high blood pressure (hypertension). It may also reduce your risk for type 2 diabetes, heart disease, and stroke. The DASH eating plan may also help with weight loss. What are tips for following this plan?  General guidelines  Avoid eating more than 2,300 mg (milligrams) of salt (sodium) a day. If you have hypertension, you may need to reduce your sodium intake to 1,500 mg a day.  Limit alcohol intake to no more than 1 drink a day for nonpregnant women and 2 drinks a day for men. One drink equals 12 oz of beer, 5 oz of wine, or 1 oz of hard liquor.  Work with your health care provider to maintain a healthy body weight or to lose weight. Ask what an ideal weight is for you.  Get at least 30 minutes of exercise that causes your heart to beat faster (aerobic exercise) most days of the week. Activities may include walking, swimming, or biking.  Work with your health care provider or diet and nutrition specialist (dietitian) to adjust your eating plan to your individual calorie needs. Reading food labels   Check food labels for the amount of sodium per serving. Choose foods with less than 5 percent of the Daily Value of sodium. Generally, foods with less than 300 mg of sodium per serving fit into this eating plan.  To find whole grains, look for the word "whole" as the first word in the ingredient list. Shopping  Buy products labeled as "low-sodium" or "no salt added."  Buy fresh foods. Avoid canned foods and premade or frozen meals. Cooking  Avoid adding salt when cooking. Use salt-free seasonings or herbs instead of table salt or sea salt. Check with your health care provider or pharmacist before using salt substitutes.  Do  not fry foods. Cook foods using healthy methods such as baking, boiling, grilling, and broiling instead.  Cook with heart-healthy oils, such as olive, canola, soybean, or sunflower oil. Meal planning  Eat a balanced diet that includes: ? 5 or more servings of fruits and vegetables each day. At each meal, try to fill half of your plate with fruits and vegetables. ? Up to 6-8 servings of whole grains each day. ? Less than 6 oz of lean meat, poultry, or fish each day. A 3-oz serving of meat is about the same size as a deck of cards. One egg equals 1 oz. ? 2 servings of low-fat dairy each day. ? A serving of nuts, seeds, or beans 5 times each week. ? Heart-healthy fats. Healthy fats called Omega-3 fatty acids are found in foods such as flaxseeds and coldwater fish, like sardines, salmon, and mackerel.  Limit how much you eat of the following: ? Canned or prepackaged foods. ? Food that is high in trans fat, such as fried foods. ? Food that is high in saturated fat, such as fatty meat. ? Sweets, desserts, sugary drinks, and other foods with added sugar. ? Full-fat dairy products.  Do not salt foods before eating.  Try to eat at least 2 vegetarian meals each week.  Eat more home-cooked food and less restaurant, buffet, and fast food.  When eating at a restaurant, ask that your food be prepared with less salt or no  salt, if possible. What foods are recommended? The items listed may not be a complete list. Talk with your dietitian about what dietary choices are best for you. Grains Whole-grain or whole-wheat bread. Whole-grain or whole-wheat pasta. Brown rice. [place]. Bulgur. Whole-grain and low-sodium cereals. Pita bread. Low-fat, low-sodium crackers. Whole-wheat flour tortillas. Vegetables Fresh or frozen vegetables (raw, steamed, roasted, or grilled). Low-sodium or reduced-sodium tomato and vegetable juice. Low-sodium or reduced-sodium tomato sauce and tomato paste. Low-sodium or  reduced-sodium canned vegetables. Fruits All fresh, dried, or frozen fruit. Canned fruit in natural juice (without added sugar). Meat and other protein foods Skinless chicken or [place]. Ground chicken or [place]. Pork with fat trimmed off. Fish and seafood. Egg whites. Dried beans, peas, or lentils. Unsalted nuts, nut butters, and seeds. Unsalted canned beans. Lean cuts of beef with fat trimmed off. Low-sodium, lean deli meat. Dairy Low-fat (1%) or fat-free (skim) milk. Fat-free, low-fat, or reduced-fat cheeses. Nonfat, low-sodium ricotta or cottage cheese. Low-fat or nonfat yogurt. Low-fat, low-sodium cheese. Fats and oils Soft margarine without trans fats. Vegetable oil. Low-fat, reduced-fat, or light mayonnaise and salad dressings (reduced-sodium). Canola, safflower, olive, soybean, and sunflower oils. Avocado. Seasoning and other foods Herbs. Spices. Seasoning mixes without salt. Unsalted popcorn and pretzels. Fat-free sweets. What foods are not recommended? The items listed may not be a complete list. Talk with your dietitian about what dietary choices are best for you. Grains Baked goods made with fat, such as croissants, muffins, or some breads. Dry pasta or rice meal packs. Vegetables Creamed or fried vegetables. Vegetables in a cheese sauce. Regular canned vegetables (not low-sodium or reduced-sodium). Regular canned tomato sauce and paste (not low-sodium or reduced-sodium). Regular tomato and vegetable juice (not low-sodium or reduced-sodium). Angie Fava. Olives. Fruits Canned fruit in a light or heavy syrup. Fried fruit. Fruit in cream or butter sauce. Meat and other protein foods Fatty cuts of meat. Ribs. Fried meat. Berniece Salines. Sausage. Bologna and other processed lunch meats. Salami. Fatback. Hotdogs. Bratwurst. Salted nuts and seeds. Canned beans with added salt. Canned or smoked fish. Whole eggs or egg yolks. Chicken or [place] with skin. Dairy Whole or 2% milk, cream, and half-and-half.  Whole or full-fat cream cheese. Whole-fat or sweetened yogurt. Full-fat cheese. Nondairy creamers. Whipped toppings. Processed cheese and cheese spreads. Fats and oils Butter. Stick margarine. Lard. Shortening. Ghee. Bacon fat. Tropical oils, such as coconut, palm kernel, or palm oil. Seasoning and other foods Salted popcorn and pretzels. Onion salt, garlic salt, seasoned salt, table salt, and sea salt. Worcestershire sauce. Tartar sauce. Barbecue sauce. Teriyaki sauce. Soy sauce, including reduced-sodium. Steak sauce. Canned and packaged gravies. Fish sauce. Oyster sauce. Cocktail sauce. Horseradish that you find on the shelf. Ketchup. Mustard. Meat flavorings and tenderizers. Bouillon cubes. Hot sauce and Tabasco sauce. Premade or packaged marinades. Premade or packaged taco seasonings. Relishes. Regular salad dressings. Where to find more information:  National Heart, Lung, and [place]: https://wilson-eaton.com/  American Heart Association: www.heart.org Summary  The DASH eating plan is a healthy eating plan that has been shown to reduce high blood pressure (hypertension). It may also reduce your risk for type 2 diabetes, heart disease, and stroke.  With the DASH eating plan, you should limit salt (sodium) intake to 2,300 mg a day. If you have hypertension, you may need to reduce your sodium intake to 1,500 mg a day.  When on the DASH eating plan, aim to eat more fresh fruits and vegetables, whole grains, lean proteins, low-fat dairy, and heart-healthy  fats.  Work with your health care provider or diet and nutrition specialist (dietitian) to adjust your eating plan to your individual calorie needs. This information is not intended to replace advice given to you by your health care provider. Make sure you discuss any questions you have with your health care provider. Document Released: 12/09/2010 Document Revised: 12/14/2015 Document Reviewed: 12/14/2015 Elsevier Interactive Patient Education   2019 [employer].

## 2018-06-26 NOTE — Telephone Encounter (Signed)
Patient Grand daughter, Donah Driver called and left message on Clinical intake stating that she will be bringing her grandmother today to her appointment.  She stated that "it is hard to bring up in front of patient but she has concerns with her memory vs someone in her world taking advantage of her."  Stated that the patient is convinced someone is coming into her home taking things from her. Stated that she seems less able to cope and repeating herself more and more.  Stated that when she says anything related to her memory she reacts extremely stronger and its difficult to move forward from there.  Trying to figure out the best way to protect her.    Wanted to make you aware before the appointment.

## 2018-06-28 ENCOUNTER — Other Ambulatory Visit: Payer: Medicare Other

## 2018-07-10 ENCOUNTER — Other Ambulatory Visit: Payer: Medicare Other

## 2018-07-10 ENCOUNTER — Other Ambulatory Visit: Payer: Self-pay

## 2018-07-10 DIAGNOSIS — E782 Mixed hyperlipidemia: Secondary | ICD-10-CM

## 2018-07-10 DIAGNOSIS — R413 Other amnesia: Secondary | ICD-10-CM

## 2018-07-11 LAB — CBC WITH DIFFERENTIAL/PLATELET
Absolute Monocytes: 451 cells/uL (ref 200–950)
Basophils Absolute: 28 cells/uL (ref 0–200)
Basophils Relative: 0.6 %
Eosinophils Absolute: 19 cells/uL (ref 15–500)
Eosinophils Relative: 0.4 %
HCT: 40.3 % (ref 35.0–45.0)
Hemoglobin: 13.3 g/dL (ref 11.7–15.5)
Lymphs Abs: 926 cells/uL (ref 850–3900)
MCH: 29.1 pg (ref 27.0–33.0)
MCHC: 33 g/dL (ref 32.0–36.0)
MCV: 88.2 fL (ref 80.0–100.0)
MPV: 9.9 fL (ref 7.5–12.5)
Monocytes Relative: 9.6 %
Neutro Abs: 3276 cells/uL (ref 1500–7800)
Neutrophils Relative %: 69.7 %
Platelets: 265 10*3/uL (ref 140–400)
RBC: 4.57 10*6/uL (ref 3.80–5.10)
RDW: 12.4 % (ref 11.0–15.0)
Total Lymphocyte: 19.7 %
WBC: 4.7 10*3/uL (ref 3.8–10.8)

## 2018-07-11 LAB — COMPLETE METABOLIC PANEL WITH GFR
AG Ratio: 1.7 (calc) (ref 1.0–2.5)
ALT: 16 U/L (ref 6–29)
AST: 21 U/L (ref 10–35)
Albumin: 3.9 g/dL (ref 3.6–5.1)
Alkaline phosphatase (APISO): 69 U/L (ref 37–153)
BUN/Creatinine Ratio: 16 (calc) (ref 6–22)
BUN: 15 mg/dL (ref 7–25)
CO2: 27 mmol/L (ref 20–32)
Calcium: 9.6 mg/dL (ref 8.6–10.4)
Chloride: 106 mmol/L (ref 98–110)
Creat: 0.93 mg/dL — ABNORMAL HIGH (ref 0.60–0.88)
GFR, Est African American: 67 mL/min/{1.73_m2} (ref >=60)
GFR, Est Non African American: 58 mL/min/{1.73_m2} — ABNORMAL LOW (ref >=60)
Globulin: 2.3 g/dL (calc) (ref 1.9–3.7)
Glucose, Bld: 100 mg/dL — ABNORMAL HIGH (ref 65–99)
Potassium: 4.7 mmol/L (ref 3.5–5.3)
Sodium: 139 mmol/L (ref 135–146)
Total Bilirubin: 0.4 mg/dL (ref 0.2–1.2)
Total Protein: 6.2 g/dL (ref 6.1–8.1)

## 2018-07-11 LAB — LIPID PANEL
Cholesterol: 231 mg/dL — ABNORMAL HIGH (ref <200)
HDL: 79 mg/dL (ref >=50)
LDL Cholesterol (Calc): 133 mg/dL (calc) — ABNORMAL HIGH
Non-HDL Cholesterol (Calc): 152 mg/dL (calc) — ABNORMAL HIGH (ref <130)
Total CHOL/HDL Ratio: 2.9 (calc) (ref <5.0)
Triglycerides: 86 mg/dL (ref <150)

## 2018-07-11 LAB — RPR: RPR Ser Ql: NONREACTIVE

## 2018-07-11 LAB — VITAMIN B12: Vitamin B-12: 270 pg/mL (ref 200–1100)

## 2018-07-11 LAB — TSH: TSH: 3.61 mIU/L (ref 0.40–4.50)

## 2018-08-16 ENCOUNTER — Other Ambulatory Visit: Payer: Medicare Other

## 2018-09-26 ENCOUNTER — Ambulatory Visit: Payer: Medicare Other | Admitting: Nurse Practitioner

## 2018-12-05 ENCOUNTER — Ambulatory Visit (INDEPENDENT_AMBULATORY_CARE_PROVIDER_SITE_OTHER): Payer: Medicare Other | Admitting: Nurse Practitioner

## 2018-12-05 ENCOUNTER — Other Ambulatory Visit: Payer: Self-pay

## 2018-12-05 ENCOUNTER — Encounter: Payer: Self-pay | Admitting: Nurse Practitioner

## 2018-12-05 VITALS — BP 132/80 | HR 67 | Temp 97.1°F | Ht 61.0 in | Wt 153.6 lb

## 2018-12-05 DIAGNOSIS — N3281 Overactive bladder: Secondary | ICD-10-CM | POA: Diagnosis not present

## 2018-12-05 DIAGNOSIS — E538 Deficiency of other specified B group vitamins: Secondary | ICD-10-CM

## 2018-12-05 DIAGNOSIS — R413 Other amnesia: Secondary | ICD-10-CM

## 2018-12-05 DIAGNOSIS — E2839 Other primary ovarian failure: Secondary | ICD-10-CM

## 2018-12-05 DIAGNOSIS — E782 Mixed hyperlipidemia: Secondary | ICD-10-CM

## 2018-12-05 MED ORDER — MIRABEGRON ER 25 MG PO TB24: 25.0000 mg | ORAL_TABLET | Freq: Every day | ORAL | 1 refills | Status: DC

## 2018-12-05 NOTE — Patient Instructions (Addendum)
To call and schedule CT of the head to evaluate memory [phone]   To start  (MYRBETRIQ) 25 MG TB24 tablet; Take 1 tablet (25 mg total) by mouth daily for overactive bladder- has to take daily, may not seen effects for 4 weeks so stick with it.

## 2018-12-05 NOTE — Progress Notes (Signed)
Careteam: Patient Care Team: [name], NP as PCP - General (Geriatric Medicine) Eston Esters, MD (Inactive) (Hematology and Oncology) Tyler Pita, MD (Radiation Oncology) Neldon Mc, MD (General Surgery)  Advanced Directive information Does Patient Have a Medical Advance Directive?: Yes, Type of Advance Directive: [place], Does patient want to make changes to medical advance directive?: No - Patient declined  No Known Allergies  Chief Complaint  Patient presents with  . Medical Management of Chronic Issues    57-monthfollowup, accompanied by husband  . Quality Metric Gaps    Need DEXA     HPI: Patient is a 84y.o. female seen in the office today for follow up. Friend with her today. They have been together for 22 years.   Hx of right breast invasive ductal carcinoma- s/p lumpectomy in 2009. Continues follow up with the long-term survivorship clinic.  Memory loss- did not get CT of head. b12 level was borderline low. Recommended supplement she occasionally takes.   oab- been going on for years.effecting sleep at night.   Review of Systems:  Review of Systems  Constitutional: Negative for chills, fever and weight loss.  HENT: Negative for tinnitus.   Respiratory: Negative for cough, sputum production and shortness of breath.   Cardiovascular: Negative for chest pain, palpitations and leg swelling.  Gastrointestinal: Negative for abdominal pain, constipation, diarrhea and heartburn.  Genitourinary: Positive for frequency. Negative for dysuria and urgency.  Musculoskeletal: Negative for back pain, falls, joint pain and myalgias.  Skin: Negative.   Neurological: Negative for dizziness and headaches.  Psychiatric/Behavioral: Positive for memory loss. Negative for depression. The patient is not nervous/anxious and does not have insomnia.     Past Medical History:  Diagnosis Date  . Cancer ([place]    rt breast  . hx: breast cancer,  right UIQ, invasive, receptor + her 2 - 09/12/2007   Past Surgical History:  Procedure Laterality Date  . BREAST LUMPECTOMY     x2 right breast  . CHO[place]CYSTECTOMY     [place] [facility] [place]    Social History:   reports that she quit smoking about 44 years ago. She has a 10.00 pack-year smoking history. She has never used smokeless tobacco. She reports that she does not drink alcohol or use drugs.  Family History  Problem Relation Age of Onset  . Cancer Mother        breast (76), leukemia (64), colon (817,   . Arthritis Mother   . Osteoporosis Mother   . Cancer Father        prostate  . Dementia Father   . Cancer Sister        breast (460s, colon (72), currently has alzheimers  . Dementia Sister   . Cancer Brother 769      colon, prostate, liver  . Cancer Paternal Grandmother        pancreatic  . Cancer Other 45       breast cancer, BRCA1 VUS and BRCA2 VUS  . Cancer Other 50       breast cancer  . Other Daughter        BRCA2 VUS  . Cancer Cousin 60       breast cancer, BRCA2 varient   . Cancer Cousin 50       breast    Medications: Patient's Medications   No medications on file    Physical Exam:  Vitals:   12/05/18 1544  BP: 132/80  Pulse: 67  Temp: (!) 97.1 F (36.2 C)  TempSrc: Temporal  SpO2: 98%  Weight: 153 lb 9.6 oz (69.7 kg)  Height: 5' 1"  (1.549 m)   Body mass index is 29.02 kg/m. Wt Readings from Last 3 Encounters:  12/05/18 153 lb 9.6 oz (69.7 kg)  06/26/18 152 lb 12.8 oz (69.3 kg)  02/28/17 157 lb 8 oz (71.4 kg)    Physical Exam Constitutional:      General: She is not in acute distress.    Appearance: She is well-developed. She is not diaphoretic.  HENT:     Head: Normocephalic and atraumatic.     Mouth/Throat:     Pharynx: No oropharyngeal exudate.  Eyes:     Conjunctiva/sclera: Conjunctivae normal.     Pupils: Pupils are equal, round, and reactive to light.  Neck:     Musculoskeletal: Normal range of motion and neck supple.   Cardiovascular:     Rate and Rhythm: Normal rate and regular rhythm.     Heart sounds: Normal heart sounds.  Pulmonary:     Effort: Pulmonary effort is normal.     Breath sounds: Normal breath sounds.  Abdominal:     General: Bowel sounds are normal.     Palpations: Abdomen is soft.  Musculoskeletal:        General: No tenderness.  Skin:    General: Skin is warm and dry.  Neurological:     Mental Status: She is alert and oriented to person, place, and time.     Labs reviewed: Basic Metabolic Panel: Recent Labs    07/10/18 0937  NA 139  K 4.7  CL 106  CO2 27  GLUCOSE 100*  BUN 15  CREATININE 0.93*  CALCIUM 9.6  TSH 3.61   Liver Function Tests: Recent Labs    07/10/18 0937  AST 21  ALT 16  BILITOT 0.4  PROT 6.2   No results for input(s): LIPASE, AMYLASE in the last 8760 hours. No results for input(s): AMMONIA in the last 8760 hours. CBC: Recent Labs    07/10/18 0937  WBC 4.7  NEUTROABS 3,276  HGB 13.3  HCT 40.3  MCV 88.2  PLT 265   Lipid Panel: Recent Labs    07/10/18 0937  CHOL 231*  HDL 79  LDLCALC 133*  TRIG 86  CHOLHDL 2.9   TSH: Recent Labs    07/10/18 0937  TSH 3.61   A1C: No results found for: HGBA1C   Assessment/Plan 1. Memory loss -orders for CT head placed but pt keeps cancelling visit. Number provided so family can schedule and take her. She does not understand that there is an issue and does not feel like she needs imaging. Very paranoid and forgetful. Daughter trying to make and confirm appts at this time. - CT Head Wo Contrast; Future  2. Estrogen deficiency - DG Bone Density; Future  3. Mixed hyperlipidemia Continue heart healthy diet. LDL and total HDL elevated.   4. OAB (overactive bladder) - mirabegron ER (MYRBETRIQ) 25 MG TB24 tablet; Take 1 tablet (25 mg total) by mouth daily.  Dispense: 30 tablet; Refill: 1   5. b12 def Encouraged to take b12 1000 mcg consistently   Next appt: AWV and 6 month follow up  [place]ia [place]. [place], [facility] [phone]

## 2018-12-14 ENCOUNTER — Encounter: Payer: Self-pay | Admitting: Nurse Practitioner

## 2018-12-14 ENCOUNTER — Telehealth: Payer: Self-pay

## 2018-12-14 ENCOUNTER — Ambulatory Visit (INDEPENDENT_AMBULATORY_CARE_PROVIDER_SITE_OTHER): Payer: Medicare Other | Admitting: Nurse Practitioner

## 2018-12-14 ENCOUNTER — Other Ambulatory Visit: Payer: Self-pay

## 2018-12-14 DIAGNOSIS — Z0000 Encounter for general adult medical examination without abnormal findings: Secondary | ICD-10-CM | POA: Diagnosis not present

## 2018-12-14 NOTE — Progress Notes (Signed)
   This service is provided via telemedicine  No vital signs collected/recorded due to the encounter was a telemedicine visit.   Location of patient (ex: home, work):  Home   Patient consents to a telephone visit:  Yes  Location of the provider (ex: office, home):  [place]  Name of any referring provider:  N/A  Names of all persons participating in the telemedicine service and their role in the encounter:Charlene Parker/ CMA, Sherrie Mustache, NP and patient.  Time spent on call:  12 minutes with CMA

## 2018-12-14 NOTE — Patient Instructions (Signed)
[name] , Thank you for taking time to come for your Medicare Wellness Visit. I appreciate your ongoing commitment to your health goals. Please review the following plan we discussed and let me know if I can assist you in the future.   Screening recommendations/referrals: Colonoscopy aged out Mammogram aged out Bone Density order placed  Recommended yearly ophthalmology/optometry visit for glaucoma screening and checkup Recommended yearly dental visit for hygiene and checkup  Vaccinations: Influenza vaccine up to date Pneumococcal vaccine up to date Tdap vaccine- DUE- RECOMMENDED TO GET AT [place]  Shingles vaccine -DUE- RECOMMENDED TO GET AT [place]   Advanced directives: on file.   Conditions/risks identified: prog  Next appointment: 1 year.    Preventive Care 82 Years and Older, Female Preventive care refers to lifestyle choices and visits with your health care provider that can promote health and wellness. What does preventive care include?  A yearly physical exam. This is also called an annual well check.  Dental exams once or twice a year.  Routine eye exams. Ask your health care provider how often you should have your eyes checked.  Personal lifestyle choices, including:  Daily care of your teeth and gums.  Regular physical activity.  Eating a healthy diet.  Avoiding tobacco and drug use.  Limiting alcohol use.  Practicing safe sex.  Taking low-dose aspirin every day.  Taking vitamin and mineral supplements as recommended by your health care provider. What happens during an annual well check? The services and screenings done by your health care provider during your annual well check will depend on your age, overall health, lifestyle risk factors, and family history of disease. Counseling  Your health care provider may ask you questions about your:  Alcohol use.  Tobacco use.  Drug use.  Emotional well-being.  Home and  relationship well-being.  Sexual activity.  Eating habits.  History of falls.  Memory and ability to understand (cognition).  Work and work Statistician.  Reproductive health. Screening  You may have the following tests or measurements:  Height, weight, and BMI.  Blood pressure.  Lipid and cholesterol levels. These may be checked every 5 years, or more frequently if you are over 60 years old.  Skin check.  Lung cancer screening. You may have this screening every year starting at age 80 if you have a 30-pack-year history of smoking and currently smoke or have quit within the past 15 years.  Fecal occult blood test (FOBT) of the stool. You may have this test every year starting at age 13.  Flexible sigmoidoscopy or colonoscopy. You may have a sigmoidoscopy every 5 years or a colonoscopy every 10 years starting at age 63.  Hepatitis C blood test.  Hepatitis B blood test.  Sexually transmitted disease (STD) testing.  Diabetes screening. This is done by checking your blood sugar (glucose) after you have not eaten for a while (fasting). You may have this done every 1-3 years.  Bone density scan. This is done to screen for osteoporosis. You may have this done starting at age 46.  Mammogram. This may be done every 1-2 years. Talk to your health care provider about how often you should have regular mammograms. Talk with your health care provider about your test results, treatment options, and if necessary, the need for more tests. Vaccines  Your health care provider may recommend certain vaccines, such as:  Influenza vaccine. This is recommended every year.  Tetanus, diphtheria, and acellular pertussis (Tdap, Td) vaccine.  You may need a Td booster every 10 years.  Zoster vaccine. You may need this after age 24.  Pneumococcal 13-valent conjugate (PCV13) vaccine. One dose is recommended after age 42.  Pneumococcal polysaccharide (PPSV23) vaccine. One dose is recommended after  age 68. Talk to your health care provider about which screenings and vaccines you need and how often you need them. This information is not intended to replace advice given to you by your health care provider. Make sure you discuss any questions you have with your health care provider. Document Released: 01/16/2015 Document Revised: 09/09/2015 Document Reviewed: 10/21/2014 Elsevier Interactive Patient Education  2017 [place] Prevention in the Home Falls can cause injuries. They can happen to people of all ages. There are many things you can do to make your home safe and to help prevent falls. What can I do on the outside of my home?  Regularly fix the edges of walkways and driveways and fix any cracks.  Remove anything that might make you trip as you walk through a door, such as a raised step or threshold.  Trim any bushes or trees on the path to your home.  Use bright outdoor lighting.  Clear any walking paths of anything that might make someone trip, such as rocks or tools.  Regularly check to see if handrails are loose or broken. Make sure that both sides of any steps have handrails.  Any raised decks and porches should have guardrails on the edges.  Have any leaves, snow, or ice cleared regularly.  Use sand or salt on walking paths during winter.  Clean up any spills in your garage right away. This includes oil or grease spills. What can I do in the bathroom?  Use night lights.  Install grab bars by the toilet and in the tub and shower. Do not use towel bars as grab bars.  Use non-skid mats or decals in the tub or shower.  If you need to sit down in the shower, use a plastic, non-slip stool.  Keep the floor dry. Clean up any water that spills on the floor as soon as it happens.  Remove soap buildup in the tub or shower regularly.  Attach bath mats securely with double-sided non-slip rug tape.  Do not have throw rugs and other things on the floor that can  make you trip. What can I do in the bedroom?  Use night lights.  Make sure that you have a light by your bed that is easy to reach.  Do not use any sheets or blankets that are too big for your bed. They should not hang down onto the floor.  Have a firm chair that has side arms. You can use this for support while you get dressed.  Do not have throw rugs and other things on the floor that can make you trip. What can I do in the kitchen?  Clean up any spills right away.  Avoid walking on wet floors.  Keep items that you use a lot in easy-to-reach places.  If you need to reach something above you, use a strong step stool that has a grab bar.  Keep electrical cords out of the way.  Do not use floor polish or wax that makes floors slippery. If you must use wax, use non-skid floor wax.  Do not have throw rugs and other things on the floor that can make you trip. What can I do with my stairs?  Do not leave any  items on the stairs.  Make sure that there are handrails on both sides of the stairs and use them. Fix handrails that are broken or loose. Make sure that handrails are as long as the stairways.  Check any carpeting to make sure that it is firmly attached to the stairs. Fix any carpet that is loose or worn.  Avoid having throw rugs at the top or bottom of the stairs. If you do have throw rugs, attach them to the floor with carpet tape.  Make sure that you have a light switch at the top of the stairs and the bottom of the stairs. If you do not have them, ask someone to add them for you. What else can I do to help prevent falls?  Wear shoes that:  Do not have high heels.  Have rubber bottoms.  Are comfortable and fit you well.  Are closed at the toe. Do not wear sandals.  If you use a stepladder:  Make sure that it is fully opened. Do not climb a closed stepladder.  Make sure that both sides of the stepladder are locked into place.  Ask someone to hold it for you,  if possible.  Clearly mark and make sure that you can see:  Any grab bars or handrails.  First and last steps.  Where the edge of each step is.  Use tools that help you move around (mobility aids) if they are needed. These include:  Canes.  Walkers.  Scooters.  Crutches.  Turn on the lights when you go into a dark area. Replace any light bulbs as soon as they burn out.  Set up your furniture so you have a clear path. Avoid moving your furniture around.  If any of your floors are uneven, fix them.  If there are any pets around you, be aware of where they are.  Review your medicines with your doctor. Some medicines can make you feel dizzy. This can increase your chance of falling. Ask your doctor what other things that you can do to help prevent falls. This information is not intended to replace advice given to you by your health care provider. Make sure you discuss any questions you have with your health care provider. Document Released: 10/16/2008 Document Revised: 05/28/2015 Document Reviewed: 01/24/2014 Elsevier Interactive Patient Education  2017 [employer].

## 2018-12-14 NOTE — Progress Notes (Signed)
Subjective:   [name] is a 84 y.o. female who presents for Medicare Annual (Subsequent) preventive examination.  Review of Systems:   Cardiac Risk Factors include: advanced age (>33mn, >>80women)     Objective:     Vitals: There were no vitals taken for this visit.  There is no height or weight on file to calculate BMI.  Advanced Directives 12/14/2018 12/05/2018 06/26/2018 01/15/2015 01/16/2014  Does Patient Have a Medical Advance Directive? Yes Yes Yes No No  Type of AIndustrial/product designerof AFreescale SemiconductorPower of ATriplettLiving will;Out of facility DNR (pink MOST or yellow form) - -  Does patient want to make changes to medical advance directive? No - Patient declined No - Patient declined - - -  Copy of HLebanonin Chart? Yes - validated most recent copy scanned in chart (See row information) Yes - validated most recent copy scanned in chart (See row information) No - copy requested - -  Pre-existing out of facility DNR order (yellow form or pink MOST form) - - Yellow form placed in chart (order not valid for inpatient use) - -    Tobacco Social History   Tobacco Use  Smoking Status Former Smoker  . Packs/day: 1.00  . Years: 10.00  . Pack years: 10.00  . Quit date: 01/03/1974  . Years since quitting: 44.9  Smokeless Tobacco Never Used  Tobacco Comment   Quit at age 84     Counseling given: Not Answered Comment: Quit at age 84   Clinical Intake:  Pre-visit preparation completed: Yes  Pain : No/denies pain     BMI - recorded: 29.02 Nutritional Status: BMI 25 -29 Overweight Nutritional Risks: None Diabetes: No  How often do you need to have someone help you when you read instructions, pamphlets, or other written materials from your doctor or pharmacy?: 1 - Never What is the last grade level you completed in school?: 12th grade        Past Medical History:  Diagnosis Date  .  [place]    [place]  . hx: breast cancer, right UIQ, invasive, receptor + her 2 - 09/12/2007   Past Surgical History:  Procedure Laterality Date  . BREAST LUMPECTOMY     x2 right breast  . CHOLECYSTECTOMY     [place]    Family History  Problem Relation Age of Onset  . Cancer Mother        breast (76), leukemia (64), colon (854,   . Arthritis Mother   . Osteoporosis Mother   . Cancer Father        prostate  . Dementia Father   . Cancer Sister        breast (422s, colon (72), currently has alzheimers  . Dementia Sister   . Cancer Brother 739      colon, prostate, liver  . Cancer Paternal Grandmother        pancreatic  . Cancer Other 45       breast cancer, BRCA1 VUS and BRCA2 VUS  . Cancer Other 50       breast cancer  . Other Daughter        BRCA2 VUS  . Cancer Cousin 60       breast cancer, BRCA2 varient   . Cancer Cousin 5101      breast   Social History   Socioeconomic History  .  Marital status: Single    Spouse name: Not on file  . Number of children: Not on file  . Years of education: Not on file  . Highest education level: Not on file  Occupational History  . Not on file  Tobacco Use  . Smoking status: Former Smoker    Packs/day: 1.00    Years: 10.00    Pack years: 10.00    Quit date: 01/03/1974    Years since quitting: 44.9  . Smokeless tobacco: Never Used  . Tobacco comment: Quit at age 66   Substance and Sexual Activity  . Alcohol use: No  . Drug use: No  . Sexual activity: Not Currently  Other Topics Concern  . 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 No   Do you have difficulty preparing food or eating? No   Do you have difficulty managing your medications? No   Do you have difficulty managing your finances? No   Do you have difficulty affording your medications? No   Social Determinants of Health   Financial Resource Strain:   . Difficulty of Paying Living Expenses: Not on file  Food Insecurity:   . Worried About Charity fundraiser in the Last Year: Not on file  . Ran Out of Food in the Last Year: Not on file  Transportation Needs:   . Lack of Transportation (Medical): Not on file  . Lack of Transportation (Non-Medical): Not on file  Physical Activity:   . Days of Exercise per Week: Not on file  . Minutes of Exercise per Session: Not on file  Stress:   . Feeling of Stress : Not on file  Social Connections:   . Frequency of Communication with Friends and Family: Not on file  . Frequency of Social Gatherings with Friends and Family: Not on file  . Attends Religious Services: Not on file  . Active Member of Clubs or Organizations: Not on file  . Attends Archivist Meetings: Not on file  . Marital Status: Not on file    Outpatient Encounter Medications as of 12/14/2018  Medication Sig  . mirabegron ER (MYRBETRIQ) 25 MG TB24 tablet Take 1 tablet (25 mg total) by mouth daily.   No facility-administered encounter medications on file as of 12/14/2018.    Activities of Daily Living In your present state of health, do you have any difficulty performing the following activities: 12/14/2018  Hearing? N  Vision? N  Difficulty concentrating or making decisions? N  Walking or climbing stairs? N  Dressing or bathing? N  Doing errands, shopping? N  Preparing Food and eating ? N  Using the Toilet? N  In the past six months, have you accidently leaked urine? N  Do you have problems with loss of bowel control? N  Managing your Medications? N  Managing your Finances? N  Housekeeping or managing your Housekeeping? N  Some recent data might be hidden    Patient Care Team: [name], NP as PCP - General (Geriatric Medicine) Eston Esters, MD (Inactive) (Hematology and Oncology) Tyler Pita, MD (Radiation Oncology) Neldon Mc, MD (General Surgery)    Assessment:   This is a routine wellness examination for Ilaisaane.  Exercise Activities and Dietary recommendations Current Exercise Habits: Home exercise routine, Type of exercise: walking, Time (Minutes): 60, Frequency (Times/Week): 1, Weekly Exercise (Minutes/Week): 60  Goals    . Patient Stated     Maintain current lifestyle       Fall Risk Fall Risk  12/14/2018 12/05/2018 06/26/2018 01/16/2014  Falls in the past year? 0 0 0 No  Number falls in past yr: 0 - - -  Injury with Fall? 0 - - -   Is the patient's home free of loose throw rugs in walkways, pet beds, electrical cords, etc?   yes      Grab bars in the bathroom? no      Handrails on the stairs?   no      Adequate lighting?   yes  Timed Get Up and Go performed: na  Depression Screen PHQ 2/9 Scores 12/14/2018 06/26/2018  PHQ - 2 Score 0 0     Cognitive Function MMSE - Mini Mental State Exam 06/26/2018  Orientation to time 4  Orientation to Place 5  Registration 3  Attention/ Calculation 5  Recall 1  Language- name 2 objects 2  Language- repeat 1  Language- follow 3 step command 3  Language- read & follow direction 1  Write a sentence 1  Copy design 0  Total score 26     6CIT Screen 12/14/2018  What Year? 0 points  What month? 0 points  What time? 3 points  Count back from 20 0 points  Months in reverse 0 points  Repeat phrase 0 points  Total Score 3    Immunization History  Administered Date(s) Administered  . Influenza, High Dose Seasonal PF 12/05/2014, 10/18/2017  . Influenza, Seasonal, Injecte, Preservative Fre 11/09/2015  . Influenza-Unspecified 10/04/2018  .  Pneumococcal Conjugate-13 10/27/2017  . Pneumococcal Polysaccharide-23 12/05/2014    Qualifies for Shingles Vaccine?yes  Screening Tests Health Maintenance  Topic Date Due  . DEXA SCAN  11/19/2001  . TETANUS/TDAP  12/05/2019 (Originally 11/20/1953)  . INFLUENZA VACCINE  Completed  . PNA vac Low Risk Adult  Completed    Cancer Screenings: Lung: Low Dose CT Chest recommended if Age 79-80 years, 30 pack-year currently smoking OR have quit w/in 15years. Patient does not qualify. Breast:  Up to date on Mammogram? Aged out Up to date of Bone Density/Dexa? No Colorectal: aged out  Additional Screenings:  Hepatitis C Screening: na     Plan:      I have personally reviewed and noted the following in the patient's chart:   . Medical and social history . Use of alcohol, tobacco or illicit drugs  . Current medications and supplements . Functional ability and status . Nutritional status . Physical activity . Advanced directives . List of other physicians . Hospitalizations, surgeries, and ER visits in previous 12 months . Vitals . Screenings to include cognitive, depression, and falls . Referrals and appointments  In addition, I have reviewed and discussed with patient certain preventive protocols, quality metrics, and best practice recommendations. A written personalized care plan for preventive services as well as general preventive health recommendations  were provided to patient.     [name], NP  12/14/2018

## 2018-12-14 NOTE — Telephone Encounter (Signed)
Patient had an annual wellness visit with [name] today and states she had her Shingrix vaccine at [facility].  I called [facility] to verify. After a 7 minute hold call ended. I will try to call again later to verify

## 2018-12-18 NOTE — Telephone Encounter (Signed)
I called [facility] again to inquire about Shingrix vaccine administration. Spoke with pharmacist.  Shingrix #1 12/14/17 Shingrix #2 09/26/2018  Information noted under immunization history

## 2019-01-07 ENCOUNTER — Ambulatory Visit
Admission: RE | Admit: 2019-01-07 | Discharge: 2019-01-07 | Disposition: A | Discharge status: Home / Self Care | Payer: Medicare Other | Source: Ambulatory Visit | Attending: Nurse Practitioner | Admitting: Nurse Practitioner

## 2019-01-07 ENCOUNTER — Other Ambulatory Visit: Payer: Self-pay

## 2019-01-07 DIAGNOSIS — R413 Other amnesia: Secondary | ICD-10-CM

## 2019-01-25 ENCOUNTER — Telehealth: Payer: Self-pay | Admitting: Nurse Practitioner
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
## 2019-01-25 NOTE — Telephone Encounter (Signed)
Pts daughter([name]) is concerned with recent changes & is comfortable with discussing all needs with friend on 1/25  Thanks, Lattie Haw

## 2019-01-28 ENCOUNTER — Ambulatory Visit: Payer: Medicare Other | Admitting: Nurse Practitioner

## 2019-01-30 ENCOUNTER — Ambulatory Visit (INDEPENDENT_AMBULATORY_CARE_PROVIDER_SITE_OTHER): Payer: Medicare Other | Admitting: Nurse Practitioner

## 2019-01-30 ENCOUNTER — Other Ambulatory Visit: Payer: Self-pay

## 2019-01-30 ENCOUNTER — Encounter: Payer: Self-pay | Admitting: Nurse Practitioner

## 2019-01-30 ENCOUNTER — Other Ambulatory Visit: Payer: Self-pay | Admitting: Nurse Practitioner

## 2019-01-30 VITALS — BP 122/78 | HR 77 | Temp 97.0°F | Ht 61.0 in | Wt 155.0 lb

## 2019-01-30 DIAGNOSIS — R413 Other amnesia: Secondary | ICD-10-CM | POA: Diagnosis not present

## 2019-01-30 DIAGNOSIS — M170 Bilateral primary osteoarthritis of knee: Secondary | ICD-10-CM

## 2019-01-30 DIAGNOSIS — E663 Overweight: Secondary | ICD-10-CM

## 2019-01-30 DIAGNOSIS — F39 Unspecified mood [affective] disorder: Secondary | ICD-10-CM

## 2019-01-30 DIAGNOSIS — Z6829 Body mass index (BMI) 29.0-29.9, adult: Secondary | ICD-10-CM

## 2019-01-30 DIAGNOSIS — E538 Deficiency of other specified B group vitamins: Secondary | ICD-10-CM | POA: Diagnosis not present

## 2019-01-30 DIAGNOSIS — N3281 Overactive bladder: Secondary | ICD-10-CM

## 2019-01-30 MED ORDER — DONEPEZIL HCL 5 MG PO TABS: 5.0000 mg | ORAL_TABLET | Freq: Every day | ORAL | 1 refills | Status: DC
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
## 2019-01-30 NOTE — Patient Instructions (Addendum)
Recommend to see Norma Fredrickson MD for evaluation  Psychotherapist in [place], [place] Address: [address], [place], [place] [address] Phone: [phone]  CT of the head was normal.   Please let us know who is following her for hx breast cancer and sign record release so we can get records.  Start aricept 5 mg daily at bedtime to preserve memory

## 2019-05-08 ENCOUNTER — Other Ambulatory Visit: Payer: Self-pay | Admitting: *Deleted

## 2019-05-08 DIAGNOSIS — R413 Other amnesia: Secondary | ICD-10-CM

## 2019-05-08 MED ORDER — DONEPEZIL HCL 5 MG PO TABS: ORAL_TABLET | ORAL | 0 refills | Status: DC

## 2019-05-08 NOTE — Telephone Encounter (Signed)
[facility] [place]. 

## 2019-05-30 ENCOUNTER — Ambulatory Visit: Payer: Self-pay | Admitting: Family

## 2019-06-05 ENCOUNTER — Ambulatory Visit (INDEPENDENT_AMBULATORY_CARE_PROVIDER_SITE_OTHER): Payer: Medicare Other | Admitting: Nurse Practitioner

## 2019-06-05 ENCOUNTER — Other Ambulatory Visit: Payer: Self-pay

## 2019-06-05 ENCOUNTER — Encounter: Payer: Self-pay | Admitting: Nurse Practitioner

## 2019-06-05 VITALS — BP 130/80 | HR 76 | Temp 97.6°F | Ht 61.0 in | Wt 155.6 lb

## 2019-06-05 DIAGNOSIS — N3281 Overactive bladder: Secondary | ICD-10-CM | POA: Diagnosis not present

## 2019-06-05 DIAGNOSIS — R011 Cardiac murmur, unspecified: Secondary | ICD-10-CM | POA: Diagnosis not present

## 2019-06-05 DIAGNOSIS — E538 Deficiency of other specified B group vitamins: Secondary | ICD-10-CM

## 2019-06-05 DIAGNOSIS — R413 Other amnesia: Secondary | ICD-10-CM

## 2019-06-05 DIAGNOSIS — E782 Mixed hyperlipidemia: Secondary | ICD-10-CM | POA: Diagnosis not present

## 2019-06-05 NOTE — Progress Notes (Signed)
Careteam: Patient Care Team: [name], NP as PCP - General (Geriatric Medicine) Eston Esters, MD (Inactive) (Hematology and Oncology) Tyler Pita, MD (Radiation Oncology) Neldon Mc, MD (General Surgery)  PLACE OF SERVICE:  [facility] Directive information Does Patient Have a Medical Advance Directive?: Yes, Type of Advance Directive: [place], Does patient want to make changes to medical advance directive?: No - Patient declined  No Known Allergies  Chief Complaint  Patient presents with  . Medical Management of Chronic Issues    6 month follow up  . Quality Metric Gaps    DEXA scan     HPI: Patient is a 85 y.o. female presents today for 6 month follow up with her friend, Nicole Kindred.   Memory loss: controlled, pt taking Aricept 5 mg tablet daily.  Mood disorder: gero psych eval- pt friend Nicole Kindred states that she did go but they do not have any new recommendations. Pt mood has been better.   OAB: no changes, pt is "living with it", pt has tried medication but refuses it due to size of pill.  Obesity/BMI 25-29.9: stable weight, 155 lbs.   B12 deficiency: pt not taking supplement at this time.   Review of Systems:  Review of Systems  Constitutional: Negative.   HENT: Negative.   Eyes: Negative.   Respiratory: Negative.   Cardiovascular: Negative.   Gastrointestinal: Negative.   Genitourinary: Positive for frequency and urgency.  Musculoskeletal: Positive for joint pain (right knee- occasionally).  Skin: Negative.   Neurological: Negative.   Psychiatric/Behavioral: Positive for memory loss.    Past Medical History:  Diagnosis Date  . Cancer ([place])    rt breast  . hx: breast cancer, right UIQ, invasive, receptor + her 2 - 09/12/2007   Past Surgical History:  Procedure Laterality Date  . BREAST LUMPECTOMY     x2 right breast  . CHOLECYSTECTOMY     Per [facility] New Patient Packet    Social History:   reports that she  quit smoking about 45 years ago. She has a 10.00 pack-year smoking history. She has never used smokeless tobacco. She reports that she does not drink alcohol or use drugs.  Family History  Problem Relation Age of Onset  . Cancer Mother        breast (76), leukemia (64), colon (56),   . Arthritis Mother   . Osteoporosis Mother   . Cancer Father        prostate  . Dementia Father   . Cancer Sister        breast (65s), colon (72), currently has alzheimers  . Dementia Sister   . Cancer Brother 61       colon, prostate, liver  . Cancer Paternal Grandmother        pancreatic  . Cancer Other 45       breast cancer, BRCA1 VUS and BRCA2 VUS  . Cancer Other 50       breast cancer  . Other Daughter        BRCA2 VUS  . Cancer Cousin 60       breast cancer, BRCA2 varient   . Cancer Cousin 50       breast    Medications: Patient's Medications  New Prescriptions   No medications on file  Previous Medications   DONEPEZIL (ARICEPT) 5 MG TABLET    Take one tablet by mouth once daily at bedtime  Modified Medications   No medications on file  Discontinued Medications   CYANOCOBALAMIN (B-12) 1000 MCG TABS    Take 1 tablet by mouth daily.    Physical Exam:  Vitals:   06/05/19 1540  BP: 130/80  Pulse: 76  Temp: 97.6 F (36.4 C)  TempSrc: Temporal  SpO2: 96%  Weight: 155 lb 9.6 oz (70.6 kg)  Height: 5' 1"  (1.549 m)   Body mass index is 29.4 kg/m. Wt Readings from Last 3 Encounters:  06/05/19 155 lb 9.6 oz (70.6 kg)  01/30/19 155 lb (70.3 kg)  12/05/18 153 lb 9.6 oz (69.7 kg)    Physical Exam Constitutional:      Appearance: Normal appearance.  HENT:     Head: Normocephalic and atraumatic.  Cardiovascular:     Rate and Rhythm: Normal rate and regular rhythm.     Heart sounds: Murmur present.  Pulmonary:     Effort: Pulmonary effort is normal.     Breath sounds: Normal breath sounds.  Musculoskeletal:     Cervical back: Normal range of motion.  Skin:    General:  Skin is warm.  Neurological:     Mental Status: She is alert.  Psychiatric:        Attention and Perception: Attention normal.        Speech: Speech normal.        Behavior: Behavior is agitated. Behavior is cooperative.        Cognition and Memory: Cognition is impaired. Memory is impaired.    Labs reviewed: Basic Metabolic Panel: Recent Labs    07/10/18 0937  NA 139  K 4.7  CL 106  CO2 27  GLUCOSE 100*  BUN 15  CREATININE 0.93*  CALCIUM 9.6  TSH 3.61   Liver Function Tests: Recent Labs    07/10/18 0937  AST 21  ALT 16  BILITOT 0.4  PROT 6.2   No results for input(s): LIPASE, AMYLASE in the last 8760 hours. No results for input(s): AMMONIA in the last 8760 hours. CBC: Recent Labs    07/10/18 0937  WBC 4.7  NEUTROABS 3,276  HGB 13.3  HCT 40.3  MCV 88.2  PLT 265   Lipid Panel: Recent Labs    07/10/18 0937  CHOL 231*  HDL 79  LDLCALC 133*  TRIG 86  CHOLHDL 2.9   TSH: Recent Labs    07/10/18 0937  TSH 3.61   A1C: No results found for: HGBA1C   Assessment/Plan 1. Murmur, cardiac Noted today,  No CP, palpitations, SOB, lower extremities swelling. Will get echo for further evaluation.  - ECHOCARDIOGRAM COMPLETE; Future  2. Moderate mixed hyperlipidemia not requiring statin therapy Had encouraged dietary modifications, noted elevated lipid panel 07/2018 will follow up  - Lipid panel; Future - CBC with Differential/Platelet; Future - COMPLETE METABOLIC PANEL WITH GFR; Future  3. B12 deficiency Encouraged to restart B12 supplement.  4. OAB (overactive bladder) Pt did not tolerate medication for this in the past. Scheduled restroom trips with most intake during the day and to limit fluid intake in the evening to avoid getting up at lot at night.   5. Memory loss -stable, had psych workup, no new medications added. Friend is unaware of any recommendations, was recommended due to her paranoia, anxiety and agitation but this has improved     Next appt:  6 months  Tc [place] K. Valentine Barney, [facility] [phone]    I personally was present during the history, physical exam and medical decision-making activities of this service and  have verified that the service and findings are accurately documented in the student's note

## 2019-06-05 NOTE — Patient Instructions (Signed)
To schedule bone density-  imaging breast center  Follow up for fasting blood work- anytime in the next few weeks  Follow up in 6 months for routine follow up

## 2019-06-12 ENCOUNTER — Ambulatory Visit: Payer: Self-pay | Admitting: Family

## 2019-06-18 ENCOUNTER — Other Ambulatory Visit: Payer: Medicare Other

## 2019-06-18 ENCOUNTER — Other Ambulatory Visit: Payer: Self-pay

## 2019-06-18 DIAGNOSIS — E782 Mixed hyperlipidemia: Secondary | ICD-10-CM

## 2019-06-18 LAB — CBC WITH DIFFERENTIAL/PLATELET
Absolute Monocytes: 381 cells/uL (ref 200–950)
Basophils Absolute: 21 cells/uL (ref 0–200)
Basophils Relative: 0.5 %
Eosinophils Absolute: 49 cells/uL (ref 15–500)
Eosinophils Relative: 1.2 %
HCT: 40.6 % (ref 35.0–45.0)
Hemoglobin: 13.6 g/dL (ref 11.7–15.5)
Lymphs Abs: 988 cells/uL (ref 850–3900)
MCH: 29.8 pg (ref 27.0–33.0)
MCHC: 33.5 g/dL (ref 32.0–36.0)
MCV: 89 fL (ref 80.0–100.0)
MPV: 10 fL (ref 7.5–12.5)
Monocytes Relative: 9.3 %
Neutro Abs: 2661 cells/uL (ref 1500–7800)
Neutrophils Relative %: 64.9 %
Platelets: 256 10*3/uL (ref 140–400)
RBC: 4.56 10*6/uL (ref 3.80–5.10)
RDW: 12.5 % (ref 11.0–15.0)
Total Lymphocyte: 24.1 %
WBC: 4.1 10*3/uL (ref 3.8–10.8)

## 2019-06-18 LAB — COMPLETE METABOLIC PANEL WITH GFR
AG Ratio: 2 (calc) (ref 1.0–2.5)
ALT: 15 U/L (ref 6–29)
AST: 18 U/L (ref 10–35)
Albumin: 4 g/dL (ref 3.6–5.1)
Alkaline phosphatase (APISO): 75 U/L (ref 37–153)
BUN: 10 mg/dL (ref 7–25)
CO2: 26 mmol/L (ref 20–32)
Calcium: 9.5 mg/dL (ref 8.6–10.4)
Chloride: 109 mmol/L (ref 98–110)
Creat: 0.88 mg/dL (ref 0.60–0.88)
GFR, Est African American: 71 mL/min/{1.73_m2} (ref >=60)
GFR, Est Non African American: 61 mL/min/{1.73_m2} (ref >=60)
Globulin: 2 g/dL (calc) (ref 1.9–3.7)
Glucose, Bld: 103 mg/dL — ABNORMAL HIGH (ref 65–99)
Potassium: 4.4 mmol/L (ref 3.5–5.3)
Sodium: 141 mmol/L (ref 135–146)
Total Bilirubin: 0.6 mg/dL (ref 0.2–1.2)
Total Protein: 6 g/dL — ABNORMAL LOW (ref 6.1–8.1)

## 2019-06-18 LAB — LIPID PANEL
Cholesterol: 226 mg/dL — ABNORMAL HIGH (ref <200)
HDL: 73 mg/dL (ref >=50)
LDL Cholesterol (Calc): 137 mg/dL (calc) — ABNORMAL HIGH
Non-HDL Cholesterol (Calc): 153 mg/dL (calc) — ABNORMAL HIGH (ref <130)
Total CHOL/HDL Ratio: 3.1 (calc) (ref <5.0)
Triglycerides: 66 mg/dL (ref <150)

## 2019-06-26 ENCOUNTER — Other Ambulatory Visit: Payer: Self-pay

## 2019-06-26 ENCOUNTER — Ambulatory Visit (HOSPITAL_COMMUNITY): Payer: Medicare Other | Attending: Internal Medicine

## 2019-06-26 DIAGNOSIS — R011 Cardiac murmur, unspecified: Secondary | ICD-10-CM

## 2019-07-18 ENCOUNTER — Other Ambulatory Visit: Payer: Self-pay

## 2019-07-18 DIAGNOSIS — R413 Other amnesia: Secondary | ICD-10-CM

## 2019-07-18 MED ORDER — DONEPEZIL HCL 5 MG PO TABS: ORAL_TABLET | ORAL | 0 refills | Status: DC

## 2019-07-18 NOTE — Telephone Encounter (Signed)
Patient daughter "Clint Guy" states that her mother 'Sherilee Smotherman" has lost her medication "Aricept 5mg " and wants a refill. Daughter is wondering if mother can have a 1 year supply of medication instead of just 3 months. Message routed to Marlowe Sax, NP due to PCP Dewaine Oats Carlos American, NP being out of office. Chrae Beatty/Charlene Colchester in message due to me being out of office tomorrow 7/16/25021 and Monday 07/22/2019 so that patient needs can be handled within a timely manner.

## 2019-08-20 ENCOUNTER — Other Ambulatory Visit: Payer: Self-pay | Admitting: *Deleted

## 2019-08-20 DIAGNOSIS — R413 Other amnesia: Secondary | ICD-10-CM

## 2019-08-20 MED ORDER — DONEPEZIL HCL 5 MG PO TABS: ORAL_TABLET | ORAL | 5 refills | Status: DC

## 2019-08-20 NOTE — Telephone Encounter (Signed)
Daughter, [name] called requesting refill. Stated that patient lost her Rx and daughter is requesting #[phone] to be called to pharmacy.

## 2019-12-13 ENCOUNTER — Encounter: Payer: Self-pay | Admitting: Nurse Practitioner

## 2019-12-13 ENCOUNTER — Other Ambulatory Visit: Payer: Self-pay

## 2019-12-13 ENCOUNTER — Ambulatory Visit (INDEPENDENT_AMBULATORY_CARE_PROVIDER_SITE_OTHER): Payer: Medicare Other | Admitting: Nurse Practitioner

## 2019-12-13 VITALS — BP 118/72 | HR 79 | Temp 96.9°F | Ht 61.0 in | Wt 149.0 lb

## 2019-12-13 DIAGNOSIS — Z1231 Encounter for screening mammogram for malignant neoplasm of breast: Secondary | ICD-10-CM | POA: Diagnosis not present

## 2019-12-13 DIAGNOSIS — E2839 Other primary ovarian failure: Secondary | ICD-10-CM | POA: Diagnosis not present

## 2019-12-13 DIAGNOSIS — H6121 Impacted cerumen, right ear: Secondary | ICD-10-CM

## 2019-12-13 DIAGNOSIS — Z23 Encounter for immunization: Secondary | ICD-10-CM | POA: Diagnosis not present

## 2019-12-13 DIAGNOSIS — N3281 Overactive bladder: Secondary | ICD-10-CM

## 2019-12-13 DIAGNOSIS — E782 Mixed hyperlipidemia: Secondary | ICD-10-CM

## 2019-12-13 DIAGNOSIS — R413 Other amnesia: Secondary | ICD-10-CM

## 2019-12-13 DIAGNOSIS — E538 Deficiency of other specified B group vitamins: Secondary | ICD-10-CM

## 2019-12-13 DIAGNOSIS — F39 Unspecified mood [affective] disorder: Secondary | ICD-10-CM

## 2019-12-13 NOTE — Patient Instructions (Signed)
Due for TDAP- to get at your local pharmacy  To call [facility] and set up bone density and mammogram appt

## 2019-12-13 NOTE — Progress Notes (Signed)
Careteam: Patient Care Team: Stephanie Chandler, NP as PCP - General (Geriatric Medicine) Stephanie Esters, MD (Inactive) (Hematology and Oncology) Stephanie Pita, MD (Radiation Oncology) Stephanie Mc, MD (General Surgery)  PLACE OF SERVICE:  [place] Directive information    No Known Allergies  Chief Complaint  Patient presents with  . Medical Management of Chronic Issues    6 month follow-up, takes medication off/on. Patient c/o ear fullness. Discuss need for covid-19 booster and td/Tdap. Discuss need for DEXA. Flu vaccine today. Here with [name], daughter.      HPI: Patient is a 85 y.o. female for routine follow up She is here today with her daughter.   Family concerned over memory loss, CT head done 01/2019 and was normal for age. Recommended psych evaluation and was diagnosised with mood disorder She was started on aricept for memory but not consistent. Did not renew it.  OAB- did not want to take medication in the past. Daughter reports it is effecting her sleep.   b12 def- recommended to take supplement  But not consistent with that.   Hx of breast cancer- last mammogram in system was 2 years ago.   Due for COVID booster, daughter plans to schedule.   Review of Systems:  Review of Systems  Constitutional: Negative for chills, fever and weight loss.  HENT: Positive for hearing loss. Negative for tinnitus.   Respiratory: Negative for cough, sputum production and shortness of breath.   Cardiovascular: Negative for chest pain, palpitations and leg swelling.  Gastrointestinal: Negative for abdominal pain, constipation, diarrhea and heartburn.  Genitourinary: Negative for dysuria, frequency and urgency.  Musculoskeletal: Negative for back pain, falls, joint pain and myalgias.  Skin: Negative.   Neurological: Negative for dizziness and headaches.  Psychiatric/Behavioral: Positive for memory loss. Negative for depression. The patient does not have  insomnia.     Past Medical History:  Diagnosis Date  . Cancer ([place])    rt breast  . hx: breast cancer, right UIQ, invasive, receptor + her 2 - 09/12/2007   Past Surgical History:  Procedure Laterality Date  . BREAST LUMPECTOMY     x2 right breast  . CHOLECYSTECTOMY     Per [facility] Patient Packet    Social History:   reports that she quit smoking about 45 years ago. She has a 10.00 pack-year smoking history. She has never used smokeless tobacco. She reports that she does not drink alcohol and does not use drugs.  Family History  Problem Relation Age of Onset  . Cancer Mother        breast (76), leukemia (64), colon (39),   . Arthritis Mother   . Osteoporosis Mother   . Cancer Father        prostate  . Dementia Father   . Cancer Sister        breast (81s), colon (72), currently has alzheimers  . Dementia Sister   . Cancer Brother 30       colon, prostate, liver  . Cancer Paternal Grandmother        pancreatic  . Cancer Other 45       breast cancer, BRCA1 VUS and BRCA2 VUS  . Cancer Other 50       breast cancer  . Other Daughter        BRCA2 VUS  . Cancer Cousin 60       breast cancer, BRCA2 varient   . Cancer Cousin 50  breast    Medications: Patient's Medications  New Prescriptions   No medications on file  Previous Medications   DONEPEZIL (ARICEPT) 5 MG TABLET    Take one tablet by mouth once daily at bedtime  Modified Medications   No medications on file  Discontinued Medications   No medications on file    Physical Exam:  Vitals:   12/13/19 1424  BP: 118/72  Pulse: 79  Temp: (!) 96.9 F (36.1 C)  TempSrc: Temporal  SpO2: 96%  Weight: 149 lb (67.6 kg)  Height: _0  (1.549 m)   Body mass index is 28.15 kg/m. Wt Readings from Last 3 Encounters:  12/13/19 149 lb (67.6 kg)  06/05/19 155 lb 9.6 oz (70.6 kg)  01/30/19 155 lb (70.3 kg)    Physical Exam Exam conducted with a chaperone present.  Constitutional:      General: She is not in  acute distress.    Appearance: She is well-developed and well-nourished. She is not diaphoretic.  HENT:     Head: Normocephalic and atraumatic.     Right Ear: There is impacted cerumen.     Mouth/Throat:     Mouth: Oropharynx is clear and moist.     Pharynx: No oropharyngeal exudate.  Eyes:     Conjunctiva/sclera: Conjunctivae normal.     Pupils: Pupils are equal, round, and reactive to light.  Cardiovascular:     Rate and Rhythm: Normal rate and regular rhythm.     Heart sounds: Normal heart sounds.  Pulmonary:     Effort: Pulmonary effort is normal.     Breath sounds: Normal breath sounds.  Chest:  Breasts:     Right: Inverted nipple (reports this is chronic change) present. No swelling, bleeding, mass, nipple discharge, skin change or tenderness.     Left: Normal.    Abdominal:     General: Bowel sounds are normal.     Palpations: Abdomen is soft.  Musculoskeletal:        General: No tenderness or edema.     Cervical back: Normal range of motion and neck supple.  Skin:    General: Skin is warm and dry.  Neurological:     Mental Status: She is alert and oriented to person, place, and time.  Psychiatric:        Mood and Affect: Mood and affect normal.     Labs reviewed: Basic Metabolic Panel: Recent Labs    06/18/19 0855  NA 141  K 4.4  CL 109  CO2 26  GLUCOSE 103*  BUN 10  CREATININE 0.88  CALCIUM 9.5   Liver Function Tests: Recent Labs    06/18/19 0855  AST 18  ALT 15  BILITOT 0.6  PROT 6.0*   No results for input(s): LIPASE, AMYLASE in the last 8760 hours. No results for input(s): AMMONIA in the last 8760 hours. CBC: Recent Labs    06/18/19 0855  WBC 4.1  NEUTROABS 2,661  HGB 13.6  HCT 40.6  MCV 89.0  PLT 256   Lipid Panel: Recent Labs    06/18/19 0855  CHOL 226*  HDL 73  LDLCALC 137*  TRIG 66  CHOLHDL 3.1   TSH: No results for input(s): TSH in the last 8760 hours. A1C: No results found for: HGBA1C   Assessment/Plan 1.  Estrogen deficiency - DG Bone Density; Future  2. Screening mammogram for breast cancer Hx of breast cancer, encouraged to get mammogram  - MM DIGITAL SCREENING BILATERAL; Future  3. Need  for influenza vaccination - Flu Vaccine QUAD High Dose(Fluad)  4. Moderate mixed hyperlipidemia not requiring statin therapy -dietary modifications encouraged, will follow up cmp and lipids before next OV  5. B12 deficiency -not on supplement, recommended to take routinely.   6. OAB (overactive bladder) -family reports issues with increase in urination however pt states she is able to manage and does not feel like she needs medication.   7. Mood disorder ([place]) Stable at this time, not currently on medications.  8. Memory loss Progressive decline, has friend/life partner that she lives with who helps her. Does not take aricept routinely.   9. Cerumen impaction -unable to remove via flush, recommended to use debrox twice daily for 4 days then return to office to flush.   Next appt: 6 months, sooner if needed  [name] K. [place], [facility] [phone]

## 2019-12-16 ENCOUNTER — Encounter: Payer: Medicare Other | Admitting: Nurse Practitioner

## 2019-12-17 ENCOUNTER — Ambulatory Visit (INDEPENDENT_AMBULATORY_CARE_PROVIDER_SITE_OTHER): Payer: Medicare Other | Admitting: Nurse Practitioner

## 2019-12-17 ENCOUNTER — Telehealth: Payer: Self-pay

## 2019-12-17 ENCOUNTER — Other Ambulatory Visit: Payer: Self-pay

## 2019-12-17 ENCOUNTER — Encounter: Payer: Self-pay | Admitting: Nurse Practitioner

## 2019-12-17 DIAGNOSIS — Z0000 Encounter for general adult medical examination without abnormal findings: Secondary | ICD-10-CM | POA: Diagnosis not present

## 2019-12-17 NOTE — Progress Notes (Signed)
Subjective:   [name] is a 85 y.o. female who presents for Medicare Annual (Subsequent) preventive examination.  Review of Systems     Cardiac Risk Factors include: sedentary lifestyle;advanced age (>40mn, >>14women)     Objective:    There were no vitals filed for this visit. There is no height or weight on file to calculate BMI.  Advanced Directives 12/17/2019 06/05/2019 12/14/2018 12/05/2018 06/26/2018 01/15/2015 01/16/2014  Does Patient Have a Medical Advance Directive? _0  No No  Type of AArts administratorPower of ACharentonof ARed BudLiving will;Out of facility DNR (pink MOST or yellow form) - -  Does patient want to make changes to medical advance directive? No - Patient declined No - Patient declined No - Patient declined No - Patient declined - - -  Copy of H[place] Chart? Yes - validated most recent copy scanned in chart (See row information) Yes - validated most recent copy scanned in chart (See row information) Yes - validated most recent copy scanned in chart (See row information) Yes - validated most recent copy scanned in chart (See row information) No - copy requested - -  Pre-existing out of facility DNR order (yellow form or pink MOST form) - - - - Yellow form placed in chart (order not valid for inpatient use) - -    Current Medications (verified) Outpatient Encounter Medications as of 12/17/2019  Medication Sig   donepezil (ARICEPT) 5 MG tablet Take one tablet by mouth once daily at bedtime   No facility-administered encounter medications on file as of 12/17/2019.    Allergies (verified) Patient has no known allergies.   History: Past Medical History:  Diagnosis Date   [place]   hx: breast cancer, right UIQ, invasive, receptor + her 2 - 09/12/2007   Past Surgical History:   Procedure Laterality Date   BREAST LUMPECTOMY     x2 right breast   CHOLECYSTECTOMY     [place] [facility] [place]    Family History  Problem Relation Age of Onset   Cancer Mother        breast ((51, leukemia ((40, colon ((41,    Arthritis Mother    Osteoporosis Mother    Cancer Father        prostate   Dementia Father    Cancer Sister        breast (430s, colon (72), currently has alzheimers   Dementia Sister    Cancer Brother 71      colon, prostate, liver   Cancer Paternal Grandmother        pancreatic   Cancer Other 435      breast cancer, BRCA1 VUS and BRCA2 VUS   Cancer Other 572      breast cancer   Other Daughter        BRCA2 VUS   Cancer Cousin 60       breast cancer, BRCA2 varient    Cancer Cousin 59      breast   Social History   Socioeconomic History   Marital status: Single    Spouse name: Not on file   Number of children: Not on file   Years of education: Not on file   Highest education level: Not on file  Occupational History   Not on file  Tobacco  Use   Smoking status: Former Smoker    Packs/day: 1.00    Years: 10.00    Pack years: 10.00    Quit date: 01/03/1974    Years since quitting: 45.9   Smokeless tobacco: Never Used   Tobacco comment: Quit at age 69   Vaping Use   Vaping Use: Never used  Substance and Sexual Activity   Alcohol use: No   Drug use: No   Sexual activity: Not Currently  Other Topics Concern   Not on file  Social History Narrative   Diet: N/A      Caffeine: Soda, coffee, and tea       Married, if yes what year: Divorced       Do you live in a house, apartment, assisted living, condo, trailer, ect: House, 2 stories, 2 persons      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 No   Do you have  difficulty preparing food or eating? No   Do you have difficulty managing your medications? No   Do you have difficulty managing your finances? No   Do you have difficulty affording your medications? No   Social Determinants of Radio broadcast assistant Strain: Not on file  Food Insecurity: Not on file  Transportation Needs: Not on file  Physical Activity: Not on file  Stress: Not on file  Social Connections: Not on file    Tobacco Counseling Counseling given: Not Answered Comment: Quit at age 70    Clinical Intake:  Pre-visit preparation completed: Yes  Pain : No/denies pain     BMI - recorded: 28 Nutritional Status: BMI 25 -29 Overweight Nutritional Risks: Unintentional weight loss Diabetes: No  How often do you need to have someone help you when you read instructions, pamphlets, or other written materials from your doctor or pharmacy?: 2 - Rarely  Diabetic?no         Activities of Daily Living In your present state of health, do you have any difficulty performing the following activities: 12/17/2019  Hearing? N  Vision? N  Difficulty concentrating or making decisions? Y  Walking or climbing stairs? N  Dressing or bathing? N  Doing errands, shopping? N  Preparing Food and eating ? N  Using the Toilet? N  In the past six months, have you accidently leaked urine? N  Do you have problems with loss of bowel control? N  Managing your Medications? N  Managing your Finances? Y  Comment daughter helps  Housekeeping or managing your Housekeeping? N  Some recent data might be hidden    Patient Care Team: [name], NP as PCP - General (Geriatric Medicine) Eston Esters, MD (Inactive) (Hematology and Oncology) Tyler Pita, MD (Radiation Oncology) Neldon Mc, MD (General Surgery)  Indicate any recent Medical Services you may have received from other than [facility] providers in the past year (date may be approximate).     Assessment:   This  is a routine wellness examination for [name].  Hearing/Vision screen  Hearing Screening   _0  _1  _2  _3  _4  _5  _6  _7  _8   Right ear:           Left ear:  Comments: Patient states she has no hearing problems  Vision Screening Comments: Patient does not have vision problems. Patient has not had recent eye exam  Dietary issues and exercise activities discussed: Current Exercise Habits: The patient does not participate in regular exercise at present  Goals     Patient Stated     Maintain current lifestyle      Depression Screen PHQ 2/9 Scores 12/17/2019 06/05/2019 12/14/2018 06/26/2018  PHQ - 2 Score 0 0 0 0    Fall Risk Fall Risk  12/17/2019 12/13/2019 06/05/2019 01/30/2019 12/14/2018  Falls in the past year? 0 0 0 0 0  Number falls in past yr: 0 0 0 0 0  Injury with Fall? 0 0 0 0 0    FALL RISK PREVENTION PERTAINING TO THE HOME:  Any stairs in or around the home? Yes  If so, are there any without handrails? No  Home free of loose throw rugs in walkways, pet beds, electrical cords, etc? No  Adequate lighting in your home to reduce risk of falls? Yes   ASSISTIVE DEVICES UTILIZED TO PREVENT FALLS:  Life alert? No  Use of a cane, walker or w/c? No  Grab bars in the bathroom? No  Shower chair or bench in shower? No  Elevated toilet seat or a handicapped toilet? No   TIMED UP AND GO:  Was the test performed? No .    Cognitive Function: MMSE - Mini Mental State Exam 12/13/2019 06/26/2018  Orientation to time 2 4  Orientation to Place 3 5  Registration 3 3  Attention/ Calculation 5 5  Recall 1 1  Language- name 2 objects 2 2  Language- repeat 1 1  Language- follow 3 step command 3 3  Language- read & follow direction 1 1  Write a sentence 1 1  Copy design 0 0  Total score 22 26     6CIT Screen 12/14/2018  What Year? 0 points  What month? 0 points  What time? 3 points  Count back from 20 0 points  Months in reverse 0 points   Repeat phrase 0 points  Total Score 3    Immunizations Immunization History  Administered Date(s) Administered   Fluad Quad(high Dose 65+) 12/13/2019   Influenza, High Dose Seasonal PF 12/05/2014, 10/18/2017   Influenza, Seasonal, Injecte, Preservative Fre 11/09/2015   Influenza-Unspecified 10/04/2018   PFIZER SARS-COV-2 Vaccination 01/24/2019, 02/14/2019   Pneumococcal Conjugate-13 10/27/2017   Pneumococcal Polysaccharide-23 12/05/2014   Zoster 01/16/2014   Zoster Recombinat (Shingrix) 12/14/2017, 09/26/2018    TDAP status: Due, Education has been provided regarding the importance of this vaccine. Advised may receive this vaccine at local pharmacy or Health Dept. Aware to provide a copy of the vaccination record if obtained from local pharmacy or Health Dept. Verbalized acceptance and understanding.  Flu Vaccine status: Up to date  Pneumococcal vaccine status: Up to date  Covid-19 vaccine status: Completed vaccines  Qualifies for Shingles Vaccine? Yes   Zostavax completed Yes   Shingrix Completed?: Yes  Screening Tests Health Maintenance  Topic Date Due   TETANUS/TDAP  Never done   DEXA SCAN  Never done   COVID-19 Vaccine (3 - Pfizer risk 4-dose series) 03/14/2019   INFLUENZA VACCINE  Completed   PNA vac Low Risk Adult  Completed    Health Maintenance  Health Maintenance Due  Topic Date Due   TETANUS/TDAP  Never done   DEXA SCAN  Never done   COVID-19 Vaccine (3 - Pfizer risk 4-dose  series) 03/14/2019    Colorectal cancer screening: No longer required.   Mammogram status: Ordered this month. Pt provided with contact info and advised to call to schedule appt.   Bone Density status: Ordered this month. Pt provided with contact info and advised to call to schedule appt.  Lung Cancer Screening: (Low Dose CT Chest recommended if Age 37-80 years, 30 pack-year currently smoking OR have quit w/in 15years.) does not qualify.   Lung Cancer Screening  Referral: na  Additional Screening:  Hepatitis C Screening: does not qualify;   Vision Screening: Recommended annual ophthalmology exams for early detection of glaucoma and other disorders of the eye. Is the patient up to date with their annual eye exam?  No  Who is the provider or what is the name of the office in which the patient attends annual eye exams? none If pt is not established with a provider, would they like to be referred to a provider to establish care? No .   Dental Screening: Recommended annual dental exams for proper oral hygiene  Community Resource Referral / Chronic Care Management: CRR required this visit?  No   CCM required this visit?  No      Plan:     I have personally reviewed and noted the following in the patients chart:    Medical and social history  Use of alcohol, tobacco or illicit drugs   Current medications and supplements  Functional ability and status  Nutritional status  Physical activity  Advanced directives  List of other physicians  Hospitalizations, surgeries, and ER visits in previous 12 months  Vitals  Screenings to include cognitive, depression, and falls  Referrals and appointments  In addition, I have reviewed and discussed with patient certain preventive protocols, quality metrics, and best practice recommendations. A written personalized care plan for preventive services as well as general preventive health recommendations were provided to patient.     [name], NP   12/17/2019    Virtual Visit via Telephone Note  I connected with@ on 12/17/19 at  1:00 PM EST by telephone and verified that I am speaking with the correct person using two identifiers.  Location: Patient: home Provider: [place]   I discussed the limitations, risks, security and privacy concerns of performing an evaluation and management service by telephone and the availability of in person appointments. I also discussed with the  patient that there may be a patient responsible charge related to this service. The patient expressed understanding and agreed to proceed.   I discussed the assessment and treatment plan with the patient. The patient was provided an opportunity to ask questions and all were answered. The patient agreed with the plan and demonstrated an understanding of the instructions.   The patient was advised to call back or seek an in-person evaluation if the symptoms worsen or if the condition fails to improve as anticipated.  I provided 18 minutes of non-face-to-face time during this encounter.  Carlos American. Harle Battiest Avs printed and mailed

## 2019-12-17 NOTE — Progress Notes (Signed)
This service is provided via telemedicine  No vital signs collected/recorded due to the encounter was a telemedicine visit.   Location of patient (ex: home, work): Home  Patient consents to a telephone visit: Yes, see encounter dated 12/17/2019  Location of the provider (ex: office, home): [place]  Name of any referring provider:  N/A  Names of all persons participating in the telemedicine service and their role in the encounter:  Sherrie Mustache, Nurse Practitioner, Carroll Kinds, CMA, and patient.   Time spent on call: 8 minutes with medical assistant

## 2019-12-17 NOTE — Telephone Encounter (Signed)
Ms. tae, robak are scheduled for a virtual visit with your provider today.    Just as we do with appointments in the office, we must obtain your consent to participate.  Your consent will be active for this visit and any virtual visit you may have with one of our providers in the next 365 days.    If you have a MyChart account, I can also send a copy of this consent to you electronically.  All virtual visits are billed to your insurance company just like a traditional visit in the office.  As this is a virtual visit, video technology does not allow for your provider to perform a traditional examination.  This may limit your provider's ability to fully assess your condition.  If your provider identifies any concerns that need to be evaluated in person or the need to arrange testing such as labs, EKG, etc, we will make arrangements to do so.    Although advances in technology are sophisticated, we cannot ensure that it will always work on either your end or our end.  If the connection with a video visit is poor, we may have to switch to a telephone visit.  With either a video or telephone visit, we are not always able to ensure that we have a secure connection.   I need to obtain your verbal consent now.   Are you willing to proceed with your visit today?   MARCILE FUQUAY has provided verbal consent on 12/17/2019 for a virtual visit (video or telephone).   Carroll Kinds, CMA 12/17/2019  1:14 PM

## 2019-12-17 NOTE — Patient Instructions (Signed)
[name] , Thank you for taking time to come for your Medicare Wellness Visit. I appreciate your ongoing commitment to your health goals. Please review the following plan we discussed and let me know if I can assist you in the future.   Screening recommendations/referrals: Colonoscopy aged out Mammogram - to call [phone] to schedule Bone Density ordered to call [phone] to schedule Recommended yearly ophthalmology/optometry visit for glaucoma screening and checkup Recommended yearly dental visit for hygiene and checkup  Vaccinations: Influenza vaccine up to date Pneumococcal vaccine up to date Tdap vaccine RECOMMENDED< to get at your local pharmacy Shingles vaccine up tod ate    Advanced directives: on file  Conditions/risks identified: advanced age, progressive memory loss  Next appointment: 1 year   Preventive Care 83 Years and Older, Female Preventive care refers to lifestyle choices and visits with your health care provider that can promote health and wellness. What does preventive care include?  A yearly physical exam. This is also called an annual well check.  Dental exams once or twice a year.  Routine eye exams. Ask your health care provider how often you should have your eyes checked.  Personal lifestyle choices, including:  Daily care of your teeth and gums.  Regular physical activity.  Eating a healthy diet.  Avoiding tobacco and drug use.  Limiting alcohol use.  Practicing safe sex.  Taking low-dose aspirin every day.  Taking vitamin and mineral supplements as recommended by your health care provider. What happens during an annual well check? The services and screenings done by your health care provider during your annual well check will depend on your age, overall health, lifestyle risk factors, and family history of disease. Counseling  Your health care provider may ask you questions about your:  Alcohol use.  Tobacco use.  Drug  use.  Emotional well-being.  Home and relationship well-being.  Sexual activity.  Eating habits.  History of falls.  Memory and ability to understand (cognition).  Work and work Statistician.  Reproductive health. Screening  You may have the following tests or measurements:  Height, weight, and BMI.  Blood pressure.  Lipid and cholesterol levels. These may be checked every 5 years, or more frequently if you are over 76 years old.  Skin check.  Lung cancer screening. You may have this screening every year starting at age 56 if you have a 30-pack-year history of smoking and currently smoke or have quit within the past 15 years.  Fecal occult blood test (FOBT) of the stool. You may have this test every year starting at age 63.  Flexible sigmoidoscopy or colonoscopy. You may have a sigmoidoscopy every 5 years or a colonoscopy every 10 years starting at age 69.  Hepatitis C blood test.  Hepatitis B blood test.  Sexually transmitted disease (STD) testing.  Diabetes screening. This is done by checking your blood sugar (glucose) after you have not eaten for a while (fasting). You may have this done every 1-3 years.  Bone density scan. This is done to screen for osteoporosis. You may have this done starting at age 62.  Mammogram. This may be done every 1-2 years. Talk to your health care provider about how often you should have regular mammograms. Talk with your health care provider about your test results, treatment options, and if necessary, the need for more tests. Vaccines  Your health care provider may recommend certain vaccines, such as:  Influenza vaccine. This is recommended every year.  Tetanus, diphtheria, and acellular pertussis (  Tdap, Td) vaccine. You may need a Td booster every 10 years.  Zoster vaccine. You may need this after age 42.  Pneumococcal 13-valent conjugate (PCV13) vaccine. One dose is recommended after age 33.  Pneumococcal polysaccharide  (PPSV23) vaccine. One dose is recommended after age 34. Talk to your health care provider about which screenings and vaccines you need and how often you need them. This information is not intended to replace advice given to you by your health care provider. Make sure you discuss any questions you have with your health care provider. Document Released: 01/16/2015 Document Revised: 09/09/2015 Document Reviewed: 10/21/2014 Elsevier Interactive Patient Education  2017 [place] Prevention in the Home Falls can cause injuries. They can happen to people of all ages. There are many things you can do to make your home safe and to help prevent falls. What can I do on the outside of my home?  Regularly fix the edges of walkways and driveways and fix any cracks.  Remove anything that might make you trip as you walk through a door, such as a raised step or threshold.  Trim any bushes or trees on the path to your home.  Use bright outdoor lighting.  Clear any walking paths of anything that might make someone trip, such as rocks or tools.  Regularly check to see if handrails are loose or broken. Make sure that both sides of any steps have handrails.  Any raised decks and porches should have guardrails on the edges.  Have any leaves, snow, or ice cleared regularly.  Use sand or salt on walking paths during winter.  Clean up any spills in your garage right away. This includes oil or grease spills. What can I do in the bathroom?  Use night lights.  Install grab bars by the toilet and in the tub and shower. Do not use towel bars as grab bars.  Use non-skid mats or decals in the tub or shower.  If you need to sit down in the shower, use a plastic, non-slip stool.  Keep the floor dry. Clean up any water that spills on the floor as soon as it happens.  Remove soap buildup in the tub or shower regularly.  Attach bath mats securely with double-sided non-slip rug tape.  Do not have  throw rugs and other things on the floor that can make you trip. What can I do in the bedroom?  Use night lights.  Make sure that you have a light by your bed that is easy to reach.  Do not use any sheets or blankets that are too big for your bed. They should not hang down onto the floor.  Have a firm chair that has side arms. You can use this for support while you get dressed.  Do not have throw rugs and other things on the floor that can make you trip. What can I do in the kitchen?  Clean up any spills right away.  Avoid walking on wet floors.  Keep items that you use a lot in easy-to-reach places.  If you need to reach something above you, use a strong step stool that has a grab bar.  Keep electrical cords out of the way.  Do not use floor polish or wax that makes floors slippery. If you must use wax, use non-skid floor wax.  Do not have throw rugs and other things on the floor that can make you trip. What can I do with my stairs?  Do  not leave any items on the stairs.  Make sure that there are handrails on both sides of the stairs and use them. Fix handrails that are broken or loose. Make sure that handrails are as long as the stairways.  Check any carpeting to make sure that it is firmly attached to the stairs. Fix any carpet that is loose or worn.  Avoid having throw rugs at the top or bottom of the stairs. If you do have throw rugs, attach them to the floor with carpet tape.  Make sure that you have a light switch at the top of the stairs and the bottom of the stairs. If you do not have them, ask someone to add them for you. What else can I do to help prevent falls?  Wear shoes that:  Do not have high heels.  Have rubber bottoms.  Are comfortable and fit you well.  Are closed at the toe. Do not wear sandals.  If you use a stepladder:  Make sure that it is fully opened. Do not climb a closed stepladder.  Make sure that both sides of the stepladder are  locked into place.  Ask someone to hold it for you, if possible.  Clearly mark and make sure that you can see:  Any grab bars or handrails.  First and last steps.  Where the edge of each step is.  Use tools that help you move around (mobility aids) if they are needed. These include:  Canes.  Walkers.  Scooters.  Crutches.  Turn on the lights when you go into a dark area. Replace any light bulbs as soon as they burn out.  Set up your furniture so you have a clear path. Avoid moving your furniture around.  If any of your floors are uneven, fix them.  If there are any pets around you, be aware of where they are.  Review your medicines with your doctor. Some medicines can make you feel dizzy. This can increase your chance of falling. Ask your doctor what other things that you can do to help prevent falls. This information is not intended to replace advice given to you by your health care provider. Make sure you discuss any questions you have with your health care provider. Document Released: 10/16/2008 Document Revised: 05/28/2015 Document Reviewed: 01/24/2014 Elsevier Interactive Patient Education  2017 [employer].

## 2020-01-10 ENCOUNTER — Telehealth: Payer: Self-pay | Admitting: *Deleted

## 2020-01-10 DIAGNOSIS — H2513 Age-related nuclear cataract, bilateral: Secondary | ICD-10-CM | POA: Diagnosis not present

## 2020-01-10 MED ORDER — VITAMIN B-12 1000 MCG PO TABS: 1000.0000 ug | ORAL_TABLET | Freq: Every day | ORAL | 3 refills | Status: AC

## 2020-01-10 NOTE — Telephone Encounter (Signed)
Vit b12 1000 mcg daily by mouth

## 2020-01-10 NOTE — Telephone Encounter (Signed)
Patient daughter, [name], called and wanted to know what dosage of Vitamin B12 you wanted patient to take. Stated that it was discussed at last OV.  Daughter is unsure what to buy.  Please Advise.     OV Note Dated 12/13/2019: b12 def- recommended to take supplement  But not consistent with that.  5. B12 deficiency -not on supplement, recommended to take routinely.

## 2020-01-10 NOTE — Telephone Encounter (Signed)
Patient daughter notified and agreed.  Medication list updated.  

## 2020-02-07 ENCOUNTER — Encounter: Payer: Self-pay | Admitting: Nurse Practitioner

## 2020-02-07 DIAGNOSIS — Z1231 Encounter for screening mammogram for malignant neoplasm of breast: Secondary | ICD-10-CM | POA: Diagnosis not present

## 2020-02-07 DIAGNOSIS — M8589 Other specified disorders of bone density and structure, multiple sites: Secondary | ICD-10-CM | POA: Diagnosis not present

## 2020-02-07 DIAGNOSIS — Z780 Asymptomatic menopausal state: Secondary | ICD-10-CM | POA: Diagnosis not present

## 2020-02-12 ENCOUNTER — Telehealth: Payer: Self-pay

## 2020-02-12 NOTE — Telephone Encounter (Signed)
Called and spoke with Daughter, [name] and she was given results of Bone Density. She verbalized her understanding of results and the need for the Caltrate.

## 2020-04-18 ENCOUNTER — Other Ambulatory Visit: Payer: Self-pay | Admitting: Family

## 2020-04-18 DIAGNOSIS — R413 Other amnesia: Secondary | ICD-10-CM

## 2020-06-08 ENCOUNTER — Other Ambulatory Visit: Payer: PRIVATE HEALTH INSURANCE

## 2020-06-08 ENCOUNTER — Other Ambulatory Visit: Payer: Self-pay

## 2020-06-08 DIAGNOSIS — F39 Unspecified mood [affective] disorder: Secondary | ICD-10-CM

## 2020-06-08 DIAGNOSIS — E538 Deficiency of other specified B group vitamins: Secondary | ICD-10-CM

## 2020-06-08 DIAGNOSIS — E782 Mixed hyperlipidemia: Secondary | ICD-10-CM

## 2020-06-09 LAB — CBC WITH DIFFERENTIAL/PLATELET
Absolute Monocytes: 382 cells/uL (ref 200–950)
Basophils Absolute: 21 cells/uL (ref 0–200)
Basophils Relative: 0.4 %
Eosinophils Absolute: 32 cells/uL (ref 15–500)
Eosinophils Relative: 0.6 %
HCT: 41.7 % (ref 35.0–45.0)
Hemoglobin: 13.6 g/dL (ref 11.7–15.5)
Lymphs Abs: 779 cells/uL — ABNORMAL LOW (ref 850–3900)
MCH: 28.9 pg (ref 27.0–33.0)
MCHC: 32.6 g/dL (ref 32.0–36.0)
MCV: 88.7 fL (ref 80.0–100.0)
MPV: 10.1 fL (ref 7.5–12.5)
Monocytes Relative: 7.2 %
Neutro Abs: 4086 cells/uL (ref 1500–7800)
Neutrophils Relative %: 77.1 %
Platelets: 274 10*3/uL (ref 140–400)
RBC: 4.7 10*6/uL (ref 3.80–5.10)
RDW: 12.1 % (ref 11.0–15.0)
Total Lymphocyte: 14.7 %
WBC: 5.3 10*3/uL (ref 3.8–10.8)

## 2020-06-09 LAB — LIPID PANEL
Cholesterol: 233 mg/dL — ABNORMAL HIGH (ref <200)
HDL: 86 mg/dL (ref >=50)
LDL Cholesterol (Calc): 132 mg/dL (calc) — ABNORMAL HIGH
Non-HDL Cholesterol (Calc): 147 mg/dL (calc) — ABNORMAL HIGH (ref <130)
Total CHOL/HDL Ratio: 2.7 (calc) (ref <5.0)
Triglycerides: 62 mg/dL (ref <150)

## 2020-06-09 LAB — COMPLETE METABOLIC PANEL WITH GFR
AG Ratio: 1.8 (calc) (ref 1.0–2.5)
ALT: 18 U/L (ref 6–29)
AST: 20 U/L (ref 10–35)
Albumin: 4.1 g/dL (ref 3.6–5.1)
Alkaline phosphatase (APISO): 78 U/L (ref 37–153)
BUN: 14 mg/dL (ref 7–25)
CO2: 26 mmol/L (ref 20–32)
Calcium: 9.7 mg/dL (ref 8.6–10.4)
Chloride: 106 mmol/L (ref 98–110)
Creat: 0.83 mg/dL (ref 0.60–0.88)
GFR, Est African American: 76 mL/min/{1.73_m2} (ref >=60)
GFR, Est Non African American: 65 mL/min/{1.73_m2} (ref >=60)
Globulin: 2.3 g/dL (calc) (ref 1.9–3.7)
Glucose, Bld: 99 mg/dL (ref 65–99)
Potassium: 4.4 mmol/L (ref 3.5–5.3)
Sodium: 141 mmol/L (ref 135–146)
Total Bilirubin: 0.4 mg/dL (ref 0.2–1.2)
Total Protein: 6.4 g/dL (ref 6.1–8.1)

## 2020-06-09 LAB — VITAMIN B12: Vitamin B-12: 447 pg/mL (ref 200–1100)

## 2020-06-12 ENCOUNTER — Other Ambulatory Visit: Payer: Self-pay

## 2020-06-12 ENCOUNTER — Encounter: Payer: Self-pay | Admitting: Nurse Practitioner

## 2020-06-12 ENCOUNTER — Ambulatory Visit (INDEPENDENT_AMBULATORY_CARE_PROVIDER_SITE_OTHER): Payer: Medicare Other | Admitting: Nurse Practitioner

## 2020-06-12 VITALS — BP 126/74 | HR 68 | Temp 97.1°F | Ht 61.0 in | Wt 146.0 lb

## 2020-06-12 DIAGNOSIS — E538 Deficiency of other specified B group vitamins: Secondary | ICD-10-CM

## 2020-06-12 DIAGNOSIS — E782 Mixed hyperlipidemia: Secondary | ICD-10-CM

## 2020-06-12 DIAGNOSIS — F39 Unspecified mood [affective] disorder: Secondary | ICD-10-CM

## 2020-06-12 DIAGNOSIS — R413 Other amnesia: Secondary | ICD-10-CM

## 2020-06-12 DIAGNOSIS — M8580 Other specified disorders of bone density and structure, unspecified site: Secondary | ICD-10-CM

## 2020-06-12 DIAGNOSIS — N3281 Overactive bladder: Secondary | ICD-10-CM | POA: Diagnosis not present

## 2020-06-12 DIAGNOSIS — H6121 Impacted cerumen, right ear: Secondary | ICD-10-CM | POA: Diagnosis not present

## 2020-06-12 MED ORDER — DONEPEZIL HCL 10 MG PO TABS: ORAL_TABLET | ORAL | 1 refills | Status: DC

## 2020-06-12 NOTE — Progress Notes (Signed)
Careteam: Patient Care Team: [name], NP as PCP - General (Geriatric Medicine) Eston Esters, MD (Inactive) (Hematology and Oncology) Tyler Pita, MD (Radiation Oncology) Neldon Mc, MD (General Surgery)  PLACE OF SERVICE:  [place] Directive information Does Patient Have a Medical Advance Directive?: Yes, Would patient like information on creating a medical advance directive?: No - Patient declined, Type of Advance Directive: [place];Living will  No Known Allergies  Chief Complaint  Patient presents with   Medical Management of Chronic Issues    6 month follow-up and discuss labs (copy printed). Discuss need for TD/tdap and covid vaccines or exclude. Here with daughter.     HPI: Patient is a 86 y.o. female for routine follow up.   She has lost weight- she reports she is eating but significant other reports he is concerned about her missing lunch.   She had mammogram and bone density  Bone density showed osteopenia  Taking vit b12 now.   Review of Systems:  Review of Systems  Constitutional:  Negative for chills, fever and weight loss.  HENT:  Negative for tinnitus.   Respiratory:  Negative for cough, sputum production and shortness of breath.   Cardiovascular:  Negative for chest pain, palpitations and leg swelling.  Gastrointestinal:  Negative for abdominal pain, constipation, diarrhea and heartburn.  Genitourinary:  Negative for dysuria, frequency and urgency.       Oab  Musculoskeletal:  Negative for back pain, falls, joint pain and myalgias.  Skin: Negative.   Neurological:  Negative for dizziness and headaches.  Psychiatric/Behavioral:  Positive for memory loss. Negative for depression. The patient does not have insomnia.        Daughter reports some agitation    Past Medical History:  Diagnosis Date   Cancer [facility])    rt breast   hx: breast cancer, right UIQ, invasive, receptor + her 2 - 09/12/2007    Past Surgical History:  Procedure Laterality Date   BREAST LUMPECTOMY     x2 right breast   CHOLECYSTECTOMY     Per [facility] New Patient Packet    Social History:   reports that she quit smoking about 46 years ago. She has a 10.00 pack-year smoking history. She has never used smokeless tobacco. She reports that she does not drink alcohol and does not use drugs.  Family History  Problem Relation Age of Onset   Cancer Mother        breast (46), leukemia (47), colon (21),    Arthritis Mother    Osteoporosis Mother    Cancer Father        prostate   Dementia Father    Cancer Sister        breast (66s), colon (72), currently has alzheimers   Dementia Sister    Cancer Brother 73       colon, prostate, liver   Cancer Paternal Grandmother        pancreatic   Cancer Other 2       breast cancer, BRCA1 VUS and BRCA2 VUS   Cancer Other 71       breast cancer   Other Daughter        BRCA2 VUS   Cancer Cousin 24       breast cancer, BRCA2 varient    Cancer Cousin 50       breast    Medications: Patient's Medications  New Prescriptions   No medications on file  Previous Medications  DONEPEZIL (ARICEPT) 5 MG TABLET    TAKE 1 TABLET BY MOUTH EVERY DAY AT BEDTIME   VITAMIN B-12 (CYANOCOBALAMIN) 1000 MCG TABLET    Take 1 tablet (1,000 mcg total) by mouth daily.  Modified Medications   No medications on file  Discontinued Medications   No medications on file    Physical Exam:  Vitals:   06/12/20 1112  BP: 126/74  Pulse: 68  Temp: (!) 97.1 F (36.2 C)  TempSrc: Temporal  SpO2: 97%  Weight: 146 lb (66.2 kg)  Height: 5' 1"  (1.549 m)   Body mass index is 27.59 kg/m. Wt Readings from Last 3 Encounters:  06/12/20 146 lb (66.2 kg)  12/13/19 149 lb (67.6 kg)  06/05/19 155 lb 9.6 oz (70.6 kg)    Physical Exam Constitutional:      General: She is not in acute distress.    Appearance: She is well-developed. She is not diaphoretic.  HENT:     Head: Normocephalic and  atraumatic.     Right Ear: There is impacted cerumen.     Mouth/Throat:     Pharynx: No oropharyngeal exudate.  Eyes:     Conjunctiva/sclera: Conjunctivae normal.     Pupils: Pupils are equal, round, and reactive to light.  Cardiovascular:     Rate and Rhythm: Normal rate and regular rhythm.     Heart sounds: Normal heart sounds.  Pulmonary:     Effort: Pulmonary effort is normal.     Breath sounds: Normal breath sounds.  Abdominal:     General: Bowel sounds are normal.     Palpations: Abdomen is soft.  Musculoskeletal:     Cervical back: Normal range of motion and neck supple.     Right lower leg: No edema.     Left lower leg: No edema.  Skin:    General: Skin is warm and dry.  Neurological:     Mental Status: She is alert. Mental status is at baseline.     Motor: No weakness.     Gait: Gait normal.  Psychiatric:        Mood and Affect: Mood normal.    Labs reviewed: Basic Metabolic Panel: Recent Labs    06/18/19 0855 06/08/20 0920  NA 141 141  K 4.4 4.4  CL 109 106  CO2 26 26  GLUCOSE 103* 99  BUN 10 14  CREATININE 0.88 0.83  CALCIUM 9.5 9.7   Liver Function Tests: Recent Labs    06/18/19 0855 06/08/20 0920  AST 18 20  ALT 15 18  BILITOT 0.6 0.4  PROT 6.0* 6.4   No results for input(s): LIPASE, AMYLASE in the last 8760 hours. No results for input(s): AMMONIA in the last 8760 hours. CBC: Recent Labs    06/18/19 0855 06/08/20 0920  WBC 4.1 5.3  NEUTROABS 2,661 4,086  HGB 13.6 13.6  HCT 40.6 41.7  MCV 89.0 88.7  PLT 256 274   Lipid Panel: Recent Labs    06/18/19 0855 06/08/20 0920  CHOL 226* 233*  HDL 73 86  LDLCALC 137* 132*  TRIG 66 62  CHOLHDL 3.1 2.7   TSH: No results for input(s): TSH in the last 8760 hours. A1C: No results found for: HGBA1C   Assessment/Plan 1. OAB (overactive bladder) Stable at this time; not on any medications.   2. B12 deficiency -improved b12 level on supplement.   3. Mixed hyperlipidemia -continue  with dietary modifications.   4. Mood disorder ([place]) -ongoing, increase agitation at times.  No longer following with psych.  5. Osteopenia, unspecified location -recommended cal and vit d with weight bearing exercise.   6. Memory loss -will increase aricept to 10 mg by mouth at bedtime -consider adding namenda at follow up appt if pt agreeable- does not like talking medications. - donepezil (ARICEPT) 10 MG tablet; TAKE 1 TABLET BY MOUTH EVERY DAY AT BEDTIME  Dispense: 90 tablet; Refill: 1  7. Cerumen impaction, right -used grabbers and flushed ear canal. Unable to remove whole impaction due to sensitivity. Encouraged to start debrox and follow up in 4 days for flush  Next appt: 6 months. Sooner if needed for ear lavage.  Carlos American. [place], [place] [phone]

## 2020-06-12 NOTE — Patient Instructions (Signed)
Increase aricept to 10 mg by mouth at bedtime  Consider starting namenda titration at next follow up  Start calcium and vit D supplement Vit D 2000 units daily Calcium 600 mg by mouth twice daily Weight bearing exercises.

## 2020-06-19 ENCOUNTER — Ambulatory Visit: Payer: PRIVATE HEALTH INSURANCE | Admitting: Family

## 2020-06-30 ENCOUNTER — Telehealth: Payer: Self-pay

## 2020-06-30 NOTE — Telephone Encounter (Signed)
Could be related, would recommend decreasing aricept back down to 5 mg to see if there is improvement in mood/behaviors.

## 2020-06-30 NOTE — Telephone Encounter (Signed)
DW/[name]. She will cut them in half for now. She will call back if no change in mood/behavior.
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
## 2020-11-07 ENCOUNTER — Other Ambulatory Visit: Payer: Self-pay | Admitting: Nurse Practitioner

## 2020-11-07 DIAGNOSIS — R413 Other amnesia: Secondary | ICD-10-CM

## 2020-11-09 MED ORDER — DONEPEZIL HCL 10 MG PO TABS: 5.0000 mg | ORAL_TABLET | Freq: Every day | ORAL | 1 refills | Status: DC

## 2020-11-10 ENCOUNTER — Other Ambulatory Visit: Payer: Self-pay | Admitting: *Deleted

## 2020-11-10 MED ORDER — DONEPEZIL HCL 5 MG PO TABS: 5.0000 mg | ORAL_TABLET | Freq: Every day | ORAL | 1 refills | Status: DC

## 2020-11-10 NOTE — Telephone Encounter (Signed)
[name], daughter, called requesting a Rx to be sent in for Donepezil 5mg  instead of the Donepezil 10mg  because it is difficult splitting the pill.   Rx sent to pharmacy.

## 2020-12-09 ENCOUNTER — Other Ambulatory Visit: Payer: Self-pay

## 2020-12-09 ENCOUNTER — Ambulatory Visit (INDEPENDENT_AMBULATORY_CARE_PROVIDER_SITE_OTHER): Payer: Medicare Other | Admitting: Nurse Practitioner

## 2020-12-09 ENCOUNTER — Encounter: Payer: Self-pay | Admitting: Nurse Practitioner

## 2020-12-09 VITALS — BP 130/78 | HR 80 | Temp 97.1°F | Ht 61.0 in | Wt 150.6 lb

## 2020-12-09 DIAGNOSIS — N3281 Overactive bladder: Secondary | ICD-10-CM

## 2020-12-09 DIAGNOSIS — L723 Sebaceous cyst: Secondary | ICD-10-CM

## 2020-12-09 DIAGNOSIS — F39 Unspecified mood [affective] disorder: Secondary | ICD-10-CM

## 2020-12-09 DIAGNOSIS — E782 Mixed hyperlipidemia: Secondary | ICD-10-CM | POA: Diagnosis not present

## 2020-12-09 DIAGNOSIS — E538 Deficiency of other specified B group vitamins: Secondary | ICD-10-CM | POA: Diagnosis not present

## 2020-12-09 DIAGNOSIS — Z23 Encounter for immunization: Secondary | ICD-10-CM | POA: Diagnosis not present

## 2020-12-09 DIAGNOSIS — R413 Other amnesia: Secondary | ICD-10-CM

## 2020-12-09 DIAGNOSIS — M8580 Other specified disorders of bone density and structure, unspecified site: Secondary | ICD-10-CM

## 2020-12-09 MED ORDER — MIRABEGRON ER 25 MG PO TB24: 25.0000 mg | ORAL_TABLET | Freq: Every day | ORAL | 1 refills | Status: DC

## 2020-12-09 NOTE — Patient Instructions (Addendum)
To use tylenol 1000 mg every 8 hours as needed for pain.   To monitor cyst on head  To start MYRBETRIQ 25 mg daily for overactive bladder. If very expensive call back so we can change medication

## 2020-12-09 NOTE — Progress Notes (Signed)
Careteam: Patient Care Team: [name], NP as PCP - General (Geriatric Medicine) Eston Esters, MD (Inactive) (Hematology and Oncology) Tyler Pita, MD (Radiation Oncology) Neldon Mc, MD (General Surgery)  PLACE OF SERVICE:  [place] Directive information Does Patient Have a Medical Advance Directive?: Yes, Type of Advance Directive: [place], Does patient want to make changes to medical advance directive?: No - Patient declined  No Known Allergies  Chief Complaint  Patient presents with   Medical Management of Chronic Issues    6 month follow up. Patient's right knee has been bothering her. Patient has constant urination. Would like to know if she should still be taking calcium   Health Maintenance    Tetanus/tdap,COVID booster, Flu vaccine     HPI: Patient is a 86 y.o. female for routine follow up   OAB- ongoing, increase in urination, does not wish to take a medication.   Hyperlipidemia- dietary modifications were encouraged, will follow up lipids today  Hx of breast cancer- has mammogram in February which was negative.   Continues on Vit B12 supplement due to deficiency  Osteopenia- taking cal and vit d supplement but does not like taking it.    Osteoarthritis in knee- having more trouble walking. Has fallen a few times.   Review of Systems:  Review of Systems  Constitutional:  Negative for chills, fever and weight loss.  HENT:  Negative for tinnitus.   Respiratory:  Negative for cough, sputum production and shortness of breath.   Cardiovascular:  Negative for chest pain, palpitations and leg swelling.  Gastrointestinal:  Negative for abdominal pain, constipation, diarrhea and heartburn.  Genitourinary:  Positive for frequency. Negative for dysuria and urgency.  Musculoskeletal:  Positive for joint pain. Negative for back pain, falls and myalgias.  Skin: Negative.   Neurological:  Negative for dizziness and  headaches.  Psychiatric/Behavioral:  Positive for hallucinations and memory loss. Negative for depression. The patient does not have insomnia.    Past Medical History:  Diagnosis Date   Cancer ([place])    rt breast   hx: breast cancer, right UIQ, invasive, receptor + her 2 - 09/12/2007   Past Surgical History:  Procedure Laterality Date   BREAST LUMPECTOMY     x2 right breast   CHOLECYSTECTOMY     Per [place] New Patient Packet    Social History:   reports that she quit smoking about 46 years ago. Her smoking use included cigarettes. She has a 10.00 pack-year smoking history. She has never used smokeless tobacco. She reports that she does not drink alcohol and does not use drugs.  Family History  Problem Relation Age of Onset   Cancer Mother        breast (19), leukemia (26), colon (46),    Arthritis Mother    Osteoporosis Mother    Cancer Father        prostate   Dementia Father    Cancer Sister        breast (57s), colon (72), currently has alzheimers   Dementia Sister    Cancer Brother 2       colon, prostate, liver   Cancer Paternal Grandmother        pancreatic   Cancer Other 31       breast cancer, BRCA1 VUS and BRCA2 VUS   Cancer Other 55       breast cancer   Other Daughter        BRCA2 VUS  Cancer Cousin 60       breast cancer, BRCA2 varient    Cancer Cousin 50       breast    Medications: Patient's Medications  New Prescriptions   No medications on file  Previous Medications   CALCIUM CARBONATE (CALCIUM 500 PO)    Take by mouth daily.   DONEPEZIL (ARICEPT) 5 MG TABLET    Take 1 tablet (5 mg total) by mouth at bedtime.   VITAMIN B-12 (CYANOCOBALAMIN) 1000 MCG TABLET    Take 1 tablet (1,000 mcg total) by mouth daily.  Modified Medications   No medications on file  Discontinued Medications   No medications on file    Physical Exam:  Vitals:   12/09/20 1117  BP: 130/78  Pulse: 80  Temp: (!) 97.1 F (36.2 C)  TempSrc: Temporal  SpO2: 96%  Weight:  150 lb 9.6 oz (68.3 kg)  Height: _0  (1.549 m)   Body mass index is 28.46 kg/m. Wt Readings from Last 3 Encounters:  12/09/20 150 lb 9.6 oz (68.3 kg)  06/12/20 146 lb (66.2 kg)  12/13/19 149 lb (67.6 kg)    Physical Exam Constitutional:      General: She is not in acute distress.    Appearance: She is well-developed. She is not diaphoretic.  HENT:     Head: Normocephalic and atraumatic.     Mouth/Throat:     Pharynx: No oropharyngeal exudate.  Eyes:     Conjunctiva/sclera: Conjunctivae normal.     Pupils: Pupils are equal, round, and reactive to light.  Cardiovascular:     Rate and Rhythm: Normal rate and regular rhythm.     Heart sounds: Normal heart sounds.  Pulmonary:     Effort: Pulmonary effort is normal.     Breath sounds: Normal breath sounds.  Abdominal:     General: Bowel sounds are normal.     Palpations: Abdomen is soft.  Musculoskeletal:     Cervical back: Normal range of motion and neck supple.     Right lower leg: No edema.     Left lower leg: No edema.  Skin:    General: Skin is warm and dry.     Comments: Raised cyst to top of crown. Slightly tender on palpitation, no heat or drainage.   Neurological:     Mental Status: She is alert. Mental status is at baseline.  Psychiatric:        Mood and Affect: Mood normal.    Labs reviewed: Basic Metabolic Panel: Recent Labs    06/08/20 0920  NA 141  K 4.4  CL 106  CO2 26  GLUCOSE 99  BUN 14  CREATININE 0.83  CALCIUM 9.7   Liver Function Tests: Recent Labs    06/08/20 0920  AST 20  ALT 18  BILITOT 0.4  PROT 6.4   No results for input(s): LIPASE, AMYLASE in the last 8760 hours. No results for input(s): AMMONIA in the last 8760 hours. CBC: Recent Labs    06/08/20 0920  WBC 5.3  NEUTROABS 4,086  HGB 13.6  HCT 41.7  MCV 88.7  PLT 274   Lipid Panel: Recent Labs    06/08/20 0920  CHOL 233*  HDL 86  LDLCALC 132*  TRIG 62  CHOLHDL 2.7   TSH: No results for input(s): TSH in  the last 8760 hours. A1C: No results found for: HGBA1C   Assessment/Plan 1. OAB (overactive bladder) -ongoing, will send Rx for myrbetriq to help with symptom  management.  - mirabegron ER (MYRBETRIQ) 25 MG TB24 tablet; Take 1 tablet (25 mg total) by mouth daily.  Dispense: 30 tablet; Refill: 1  2. Mixed hyperlipidemia -continue on dietary modifications.  - Lipid Panel - CMP with eGFR([facility])  3. B12 deficiency -continue on supplement.   4. Mood disorder ([place]) -stable, continues to have paranoia that someone is stealing from her.   5. Osteopenia, unspecified location -Recommended to take calcium 600 mg twice daily with Vitamin D 2000 units daily and weight bearing activity 30 mins/5 days a week.   6. Memory loss -ongoing, continues to have support from family and friends.    Next appt: 6 weeks.  Carlos American. [place], [facility] [phone]

## 2020-12-10 ENCOUNTER — Telehealth: Payer: Self-pay

## 2020-12-10 LAB — COMPLETE METABOLIC PANEL WITHOUT GFR
AG Ratio: 1.5 (calc) (ref 1.0–2.5)
ALT: 16 U/L (ref 6–29)
AST: 20 U/L (ref 10–35)
Albumin: 3.8 g/dL (ref 3.6–5.1)
Alkaline phosphatase (APISO): 76 U/L (ref 37–153)
BUN: 13 mg/dL (ref 7–25)
CO2: 24 mmol/L (ref 20–32)
Calcium: 9.5 mg/dL (ref 8.6–10.4)
Chloride: 105 mmol/L (ref 98–110)
Creat: 0.82 mg/dL (ref 0.60–0.95)
Globulin: 2.5 g/dL (ref 1.9–3.7)
Glucose, Bld: 82 mg/dL (ref 65–139)
Potassium: 4.3 mmol/L (ref 3.5–5.3)
Sodium: 140 mmol/L (ref 135–146)
Total Bilirubin: 0.6 mg/dL (ref 0.2–1.2)
Total Protein: 6.3 g/dL (ref 6.1–8.1)
eGFR: 70 mL/min/{1.73_m2}

## 2020-12-10 LAB — LIPID PANEL
Cholesterol: 218 mg/dL — ABNORMAL HIGH
HDL: 89 mg/dL
LDL Cholesterol (Calc): 114 mg/dL — ABNORMAL HIGH
Non-HDL Cholesterol (Calc): 129 mg/dL
Total CHOL/HDL Ratio: 2.4 (calc)
Triglycerides: 63 mg/dL

## 2020-12-10 MED ORDER — SOLIFENACIN SUCCINATE 5 MG PO TABS: 5.0000 mg | ORAL_TABLET | Freq: Every day | ORAL | 1 refills | Status: DC

## 2020-12-10 NOTE — Telephone Encounter (Signed)
To not get the myrbetriq- dc from medication list and start vesicare 5 mg by mouth  daily

## 2020-12-10 NOTE — Telephone Encounter (Signed)
Spoke with daughter, [name] and she stated that the Myrbetriq is not covered by insurance. The cost of the medication is $400+. She would like to know if there is something else or if it is just better to leave it alone since she has had this for a while. Please advise.  Message routed to Sherrie Mustache, NP

## 2020-12-10 NOTE — Telephone Encounter (Signed)
Patient daughter notified and agreed.  

## 2020-12-11 ENCOUNTER — Ambulatory Visit: Payer: PRIVATE HEALTH INSURANCE | Admitting: Nurse Practitioner

## 2020-12-14 ENCOUNTER — Telehealth: Payer: Self-pay | Admitting: Nurse Practitioner

## 2020-12-14 DIAGNOSIS — M1710 Unilateral primary osteoarthritis, unspecified knee: Secondary | ICD-10-CM

## 2020-12-14 NOTE — Telephone Encounter (Signed)
[name] stated to go ahead and place referral.

## 2020-12-14 NOTE — Telephone Encounter (Signed)
If she would like I can place a referral into orthopedic for evaluation. Sounds like she may need to have fluid removed and an medication to knee to help with the pain

## 2020-12-14 NOTE — Telephone Encounter (Signed)
Pts daughter([name]) called stating that pts right knee has begin to swell more, since her appt with Janett Billow 12/7.  Over the weekend, not able to walk or put foot down without pain. No redness or warm to touch.   Please advise  [name].

## 2020-12-22 ENCOUNTER — Telehealth: Payer: Self-pay

## 2020-12-22 ENCOUNTER — Other Ambulatory Visit: Payer: Self-pay

## 2020-12-22 ENCOUNTER — Ambulatory Visit (INDEPENDENT_AMBULATORY_CARE_PROVIDER_SITE_OTHER): Payer: Medicare Other | Admitting: Nurse Practitioner

## 2020-12-22 ENCOUNTER — Encounter: Payer: Self-pay | Admitting: Nurse Practitioner

## 2020-12-22 DIAGNOSIS — Z0000 Encounter for general adult medical examination without abnormal findings: Secondary | ICD-10-CM | POA: Diagnosis not present

## 2020-12-22 NOTE — Patient Instructions (Signed)
[name] , Thank you for taking time to come for your Medicare Wellness Visit. I appreciate your ongoing commitment to your health goals. Please review the following plan we discussed and let me know if I can assist you in the future.   Screening recommendations/referrals: Colonoscopy aged out Mammogram aged out Bone Density up to date Recommended yearly ophthalmology/optometry visit for glaucoma screening and checkup Recommended yearly dental visit for hygiene and checkup  Vaccinations: Influenza vaccine up to date Pneumococcal vaccine up to date Tdap vaccine RECOMMENDED- to get at local pharmacy Shingles vaccine up to date    Advanced directives: on file  Conditions/risks identified: advance age, progressive memory loss, fall risk   Next appointment: yearly    Preventive Care 64 Years and Older, Female Preventive care refers to lifestyle choices and visits with your health care provider that can promote health and wellness. What does preventive care include? A yearly physical exam. This is also called an annual well check. Dental exams once or twice a year. Routine eye exams. Ask your health care provider how often you should have your eyes checked. Personal lifestyle choices, including: Daily care of your teeth and gums. Regular physical activity. Eating a healthy diet. Avoiding tobacco and drug use. Limiting alcohol use. Practicing safe sex. Taking low-dose aspirin every day. Taking vitamin and mineral supplements as recommended by your health care provider. What happens during an annual well check? The services and screenings done by your health care provider during your annual well check will depend on your age, overall health, lifestyle risk factors, and family history of disease. Counseling  Your health care provider may ask you questions about your: Alcohol use. Tobacco use. Drug use. Emotional well-being. Home and relationship well-being. Sexual  activity. Eating habits. History of falls. Memory and ability to understand (cognition). Work and work Statistician. Reproductive health. Screening  You may have the following tests or measurements: Height, weight, and BMI. Blood pressure. Lipid and cholesterol levels. These may be checked every 5 years, or more frequently if you are over 59 years old. Skin check. Lung cancer screening. You may have this screening every year starting at age 70 if you have a 30-pack-year history of smoking and currently smoke or have quit within the past 15 years. Fecal occult blood test (FOBT) of the stool. You may have this test every year starting at age 35. Flexible sigmoidoscopy or colonoscopy. You may have a sigmoidoscopy every 5 years or a colonoscopy every 10 years starting at age 52. Hepatitis C blood test. Hepatitis B blood test. Sexually transmitted disease (STD) testing. Diabetes screening. This is done by checking your blood sugar (glucose) after you have not eaten for a while (fasting). You may have this done every 1-3 years. Bone density scan. This is done to screen for osteoporosis. You may have this done starting at age 48. Mammogram. This may be done every 1-2 years. Talk to your health care provider about how often you should have regular mammograms. Talk with your health care provider about your test results, treatment options, and if necessary, the need for more tests. Vaccines  Your health care provider may recommend certain vaccines, such as: Influenza vaccine. This is recommended every year. Tetanus, diphtheria, and acellular pertussis (Tdap, Td) vaccine. You may need a Td booster every 10 years. Zoster vaccine. You may need this after age 25. Pneumococcal 13-valent conjugate (PCV13) vaccine. One dose is recommended after age 91. Pneumococcal polysaccharide (PPSV23) vaccine. One dose is recommended after age  82. Talk to your health care provider about which screenings and vaccines  you need and how often you need them. This information is not intended to replace advice given to you by your health care provider. Make sure you discuss any questions you have with your health care provider. Document Released: 01/16/2015 Document Revised: 09/09/2015 Document Reviewed: 10/21/2014 Elsevier Interactive Patient Education  2017 [employer] Prevention in the Home Falls can cause injuries. They can happen to people of all ages. There are many things you can do to make your home safe and to help prevent falls. What can I do on the outside of my home? Regularly fix the edges of walkways and driveways and fix any cracks. Remove anything that might make you trip as you walk through a door, such as a raised step or threshold. Trim any bushes or trees on the path to your home. Use bright outdoor lighting. Clear any walking paths of anything that might make someone trip, such as rocks or tools. Regularly check to see if handrails are loose or broken. Make sure that both sides of any steps have handrails. Any raised decks and porches should have guardrails on the edges. Have any leaves, snow, or ice cleared regularly. Use sand or salt on walking paths during winter. Clean up any spills in your garage right away. This includes oil or grease spills. What can I do in the bathroom? Use night lights. Install grab bars by the toilet and in the tub and shower. Do not use towel bars as grab bars. Use non-skid mats or decals in the tub or shower. If you need to sit down in the shower, use a plastic, non-slip stool. Keep the floor dry. Clean up any water that spills on the floor as soon as it happens. Remove soap buildup in the tub or shower regularly. Attach bath mats securely with double-sided non-slip rug tape. Do not have throw rugs and other things on the floor that can make you trip. What can I do in the bedroom? Use night lights. Make sure that you have a light by your bed that  is easy to reach. Do not use any sheets or blankets that are too big for your bed. They should not hang down onto the floor. Have a firm chair that has side arms. You can use this for support while you get dressed. Do not have throw rugs and other things on the floor that can make you trip. What can I do in the kitchen? Clean up any spills right away. Avoid walking on wet floors. Keep items that you use a lot in easy-to-reach places. If you need to reach something above you, use a strong step stool that has a grab bar. Keep electrical cords out of the way. Do not use floor polish or wax that makes floors slippery. If you must use wax, use non-skid floor wax. Do not have throw rugs and other things on the floor that can make you trip. What can I do with my stairs? Do not leave any items on the stairs. Make sure that there are handrails on both sides of the stairs and use them. Fix handrails that are broken or loose. Make sure that handrails are as long as the stairways. Check any carpeting to make sure that it is firmly attached to the stairs. Fix any carpet that is loose or worn. Avoid having throw rugs at the top or bottom of the stairs. If you do have  throw rugs, attach them to the floor with carpet tape. Make sure that you have a light switch at the top of the stairs and the bottom of the stairs. If you do not have them, ask someone to add them for you. What else can I do to help prevent falls? Wear shoes that: Do not have high heels. Have rubber bottoms. Are comfortable and fit you well. Are closed at the toe. Do not wear sandals. If you use a stepladder: Make sure that it is fully opened. Do not climb a closed stepladder. Make sure that both sides of the stepladder are locked into place. Ask someone to hold it for you, if possible. Clearly mark and make sure that you can see: Any grab bars or handrails. First and last steps. Where the edge of each step is. Use tools that help you  move around (mobility aids) if they are needed. These include: Canes. Walkers. Scooters. Crutches. Turn on the lights when you go into a dark area. Replace any light bulbs as soon as they burn out. Set up your furniture so you have a clear path. Avoid moving your furniture around. If any of your floors are uneven, fix them. If there are any pets around you, be aware of where they are. Review your medicines with your doctor. Some medicines can make you feel dizzy. This can increase your chance of falling. Ask your doctor what other things that you can do to help prevent falls. This information is not intended to replace advice given to you by your health care provider. Make sure you discuss any questions you have with your health care provider. Document Released: 10/16/2008 Document Revised: 05/28/2015 Document Reviewed: 01/24/2014 Elsevier Interactive Patient Education  2017 [employer].

## 2020-12-22 NOTE — Telephone Encounter (Signed)
[name], [name] are scheduled for a virtual visit with your provider today.    Just as we do with appointments in the office, we must obtain your consent to participate.  Your consent will be active for this visit and any virtual visit you may have with one of our providers in the next 365 days.    If you have a MyChart account, I can also send a copy of this consent to you electronically.  All virtual visits are billed to your insurance company just like a traditional visit in the office.  As this is a virtual visit, video technology does not allow for your provider to perform a traditional examination.  This may limit your provider's ability to fully assess your condition.  If your provider identifies any concerns that need to be evaluated in person or the need to arrange testing such as labs, EKG, etc, we will make arrangements to do so.    Although advances in technology are sophisticated, we cannot ensure that it will always work on either your end or our end.  If the connection with a video visit is poor, we may have to switch to a telephone visit.  With either a video or telephone visit, we are not always able to ensure that we have a secure connection.   I need to obtain your verbal consent now.   Are you willing to proceed with your visit today?   [name] has provided verbal consent on 12/22/2020 for a virtual visit (video or telephone).   Carroll Kinds, CMA 12/22/2020  10:46 AM

## 2020-12-22 NOTE — Progress Notes (Signed)
Subjective:   [name] is a 86 y.o. female who presents for Medicare Annual (Subsequent) preventive examination.  Review of Systems     Cardiac Risk Factors include: advanced age (>25mn, >>54women)     Objective:    Today's Vitals   12/22/20 1057  PainSc: 2    There is no height or weight on file to calculate BMI.  Advanced Directives 12/22/2020 12/09/2020 06/12/2020 12/17/2019 06/05/2019 12/14/2018 12/05/2018  Does Patient Have a Medical Advance Directive? Yes Yes Yes Yes Yes Yes Yes  Type of AIndustrial/product designerof AFreescale SemiconductorPower of AAdair VillageLiving will Healthcare Power of [place] Does patient want to make changes to medical advance directive? No - Patient declined No - Patient declined - No - Patient declined No - Patient declined No - Patient declined No - Patient declined  Copy of HHol[place]in Chart? Yes - validated most recent copy scanned in chart (See row information) Yes - validated most recent copy scanned in chart (See row information) Yes - validated most recent copy scanned in chart (See row information) Yes - validated most recent copy scanned in chart (See row information) Yes - validated most recent copy scanned in chart (See row information) Yes - validated most recent copy scanned in chart (See row information) Yes - validated most recent copy scanned in chart (See row information)  Would patient like information on creating a medical advance directive? - - No - Patient declined - - - -  Pre-existing out of facility DNR order (yellow form or pink MOST form) - - - - - - -    Current Medications (verified) Outpatient Encounter Medications as of 12/22/2020  Medication Sig   Calcium Carbonate (CALCIUM 500 PO) Take by mouth daily.   donepezil (ARICEPT) 5 MG tablet Take 1 tablet (5 mg total) by mouth at  bedtime.   solifenacin (VESICARE) 5 MG tablet Take 1 tablet (5 mg total) by mouth daily.   vitamin B-12 (CYANOCOBALAMIN) 1000 MCG tablet Take 1 tablet (1,000 mcg total) by mouth daily.   No facility-administered encounter medications on file as of 12/22/2020.    Allergies (verified) Patient has no known allergies.   History: Past Medical History:  Diagnosis Date   [place]    [place]   hx: breast cancer, right UIQ, invasive, receptor + her 2 - 09/12/2007   Past Surgical History:  Procedure Laterality Date   BREAST LUMPECTOMY     x2 right breast   CHOLECYSTECTOMY     Per [place]    Family History  Problem Relation Age of Onset   Cancer Mother        breast ((6, leukemia ((76, colon ((46,    Arthritis Mother    Osteoporosis Mother    Cancer Father        prostate   Dementia Father    Cancer Sister        breast (478s, colon (72), currently has alzheimers   Dementia Sister    Cancer Brother 777      colon, prostate, liver   Cancer Paternal Grandmother        pancreatic   Cancer Other 460      breast cancer, BRCA1 VUS and BRCA2 VUS   Cancer Other 558      breast cancer   Other Daughter  BRCA2 VUS   Cancer Cousin 60       breast cancer, BRCA2 varient    Cancer Cousin 56       breast   Social History   Socioeconomic History   Marital status: Single    Spouse name: Not on file   Number of children: Not on file   Years of education: Not on file   Highest education level: Not on file  Occupational History   Not on file  Tobacco Use   Smoking status: Former    Packs/day: 1.00    Years: 10.00    Pack years: 10.00    Types: Cigarettes    Quit date: 01/03/1974    Years since quitting: 47.0   Smokeless tobacco: Never   Tobacco comments:    Quit at age 33   Vaping Use   Vaping Use: Never used  Substance and Sexual Activity   Alcohol use: No   Drug use: No   Sexual activity: Not Currently  Other Topics Concern   Not on file   Social History Narrative   Diet: N/A      Caffeine: Soda, coffee, and tea       Married, if yes what year: Divorced       Do you live in a house, apartment, assisted living, condo, trailer, ect: House, 2 stories, 2 persons      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 No   Do you have difficulty preparing food or eating? No   Do you have difficulty managing your medications? No   Do you have difficulty managing your finances? No   Do you have difficulty affording your medications? No   Social Determinants of Radio broadcast assistant Strain: Not on file  Food Insecurity: Not on file  Transportation Needs: Not on file  Physical Activity: Not on file  Stress: Not on file  Social Connections: Not on file    Tobacco Counseling Counseling given: Not Answered Tobacco comments: Quit at age 36    Clinical Intake:  Pre-visit preparation completed: Yes  Pain : 0-10 Pain Score: 2  Pain Type: Chronic pain Pain Location: Knee Pain Orientation: Right Pain Descriptors / Indicators: Aching Pain Onset: More than a month ago Pain Frequency: Constant     BMI - recorded: 28 Nutritional Status: BMI 25 -29 Overweight Diabetes: No  How often do you need to have someone help you when you read instructions, pamphlets, or other written materials from your doctor or pharmacy?: 1 - Never  Diabetic?no         Activities of Daily Living In your present state of health, do you have any difficulty performing the following activities: 12/22/2020  Hearing? N  Vision? N  Difficulty concentrating or making decisions? Y  Walking or climbing stairs? N  Dressing or bathing? N  Doing errands, shopping? Y  Preparing Food and eating ? N  Using the Toilet? N  In the past six months, have you  accidently leaked urine? N  Do you have problems with loss of bowel control? N  Managing your Medications? Y  Comment family helps  Managing your Finances? Y  Housekeeping  or managing your Housekeeping? Y  Some recent data might be hidden    Patient Care Team: [name], NP as PCP - General (Geriatric Medicine) Eston Esters, MD (Inactive) (Hematology and Oncology) Tyler Pita, MD (Radiation Oncology) Neldon Mc, MD (General Surgery)  Indicate any recent Medical Services you may have received from other than [facility] providers in the past year (date may be approximate).     Assessment:   This is a routine wellness examination for [name].  Hearing/Vision screen Hearing Screening - Comments:: No problems Vision Screening - Comments:: No problems. Patient has had ey exam within past year. Patient sees [facility].  Dietary issues and exercise activities discussed: Current Exercise Habits: The patient does not participate in regular exercise at present, Exercise limited by: orthopedic condition(s)   Goals Addressed   None    Depression Screen PHQ 2/9 Scores 12/22/2020 06/12/2020 12/17/2019 06/05/2019 12/14/2018 06/26/2018  PHQ - 2 Score 0 0 0 0 0 0    Fall Risk Fall Risk  12/22/2020 12/09/2020 06/12/2020 12/17/2019 12/13/2019  Falls in the past year? 1 0 1 0 0  Number falls in past yr: 1 0 0 0 0  Injury with Fall? 0 0 0 0 0  Risk for fall due to : History of fall(s) No Fall Risks - - -  Follow up Falls evaluation completed Falls evaluation completed - - -    FALL RISK PREVENTION PERTAINING TO THE HOME:  Any stairs in or around the home? Yes  If so, are there any without handrails? No  Home free of loose throw rugs in walkways, pet beds, electrical cords, etc? Yes  Adequate lighting in your home to reduce risk of falls? Yes   ASSISTIVE DEVICES UTILIZED TO PREVENT FALLS:  Life alert? No  Use of a cane, walker or w/c? No  Grab bars in the bathroom? No   Shower chair or bench in shower? No  Elevated toilet seat or a handicapped toilet? No   TIMED UP AND GO:  Was the test performed? No .    Cognitive Function: MMSE - Mini Mental State Exam 12/13/2019 06/26/2018  Orientation to time 2 4  Orientation to Place 3 5  Registration 3 3  Attention/ Calculation 5 5  Recall 1 1  Language- name 2 objects 2 2  Language- repeat 1 1  Language- follow 3 step command 3 3  Language- read & follow direction 1 1  Write a sentence 1 1  Copy design 0 0  Total score 22 26     6CIT Screen 12/22/2020 12/14/2018  What Year? 0 points 0 points  What month? 0 points 0 points  What time? 0 points 3 points  Count back from 20 0 points 0 points  Months in reverse 0 points 0 points  Repeat phrase 10 points 0 points  Total Score 10 3    Immunizations Immunization History  Administered Date(s) Administered   Fluad Quad(high Dose 65+) 12/13/2019, 12/09/2020   Influenza, High Dose Seasonal PF 12/05/2014, 10/18/2017   Influenza, Seasonal, Injecte, Preservative Fre 11/09/2015   Influenza-Unspecified 10/04/2018   PFIZER(Purple Top)SARS-COV-2 Vaccination 01/24/2019, 02/14/2019   Pneumococcal Conjugate-13 10/27/2017   Pneumococcal Polysaccharide-23 12/05/2014   Zoster Recombinat (Shingrix) 12/14/2017, 09/26/2018   Zoster, Live 01/16/2014    TDAP status: Due, Education has been provided regarding the importance of this vaccine. Advised may receive this vaccine at local pharmacy or Health Dept. Aware to provide a copy of the vaccination record if obtained  from local pharmacy or Health Dept. Verbalized acceptance and understanding.  Flu Vaccine status: Up to date  Pneumococcal vaccine status: Up to date  Covid-19 vaccine status: Information provided on how to obtain vaccines.   Qualifies for Shingles Vaccine? Yes   Zostavax completed Yes   Shingrix Completed?: Yes  Screening Tests Health Maintenance  Topic Date Due   TETANUS/TDAP  Never done    COVID-19 Vaccine (3 - Pfizer risk series) 03/14/2019   Pneumonia Vaccine 15+ Years old  Completed   INFLUENZA VACCINE  Completed   DEXA SCAN  Completed   Zoster Vaccines- Shingrix  Completed   HPV VACCINES  Aged Out    Health Maintenance  Health Maintenance Due  Topic Date Due   TETANUS/TDAP  Never done   COVID-19 Vaccine (3 - Pfizer risk series) 03/14/2019    Colorectal cancer screening: No longer required.   Mammogram status: No longer required due to age.  Bone Density status: Completed 02/07/20. Results reflect: Bone density results: OSTEOPENIA. Repeat every 2 years.  Lung Cancer Screening: (Low Dose CT Chest recommended if Age 98-80 years, 30 pack-year currently smoking OR have quit w/in 15years.) does not qualify.   Lung Cancer Screening Referral: na  Additional Screening:  Hepatitis C Screening: does not qualify; Completed na  Vision Screening: Recommended annual ophthalmology exams for early detection of glaucoma and other disorders of the eye. Is the patient up to date with their annual eye exam?  Yes  Who is the provider or what is the name of the office in which the patient attends annual eye exams? [facility] If pt is not established with a provider, would they like to be referred to a provider to establish care? No .   Dental Screening: Recommended annual dental exams for proper oral hygiene  Community Resource Referral / Chronic Care Management: CRR required this visit?  No   CCM required this visit?  No      Plan:     I have personally reviewed and noted the following in the patients chart:   Medical and social history Use of alcohol, tobacco or illicit drugs  Current medications and supplements including opioid prescriptions.  Functional ability and status Nutritional status Physical activity Advanced directives List of other physicians Hospitalizations, surgeries, and ER visits in previous 12 months Vitals Screenings to include cognitive,  depression, and falls Referrals and appointments  In addition, I have reviewed and discussed with patient certain preventive protocols, quality metrics, and best practice recommendations. A written personalized care plan for preventive services as well as general preventive health recommendations were provided to patient.     [name], NP   12/22/2020    Virtual Visit via Telephone Note  I connected withNAME@ on 12/22/20 at 10:30 AM EST by telephone and verified that I am speaking with the correct person using two identifiers.  Location: Patient: home Provider: [place].    I discussed the limitations, risks, security and privacy concerns of performing an evaluation and management service by telephone and the availability of in person appointments. I also discussed with the patient that there may be a patient responsible charge related to this service. The patient expressed understanding and agreed to proceed.   I discussed the assessment and treatment plan with the patient. The patient was provided an opportunity to ask questions and all were answered. The patient agreed with the plan and demonstrated an understanding of the instructions.   The patient was advised to call back or  seek an in-person evaluation if the symptoms worsen or if the condition fails to improve as anticipated.  I provided 15 minutes of non-face-to-face time during this encounter.  Carlos American. Harle Battiest Avs printed and mailed

## 2020-12-22 NOTE — Progress Notes (Signed)
This service is provided via telemedicine  No vital signs collected/recorded due to the encounter was a telemedicine visit.   Location of patient (ex: home, work):  Home  Patient consents to a telephone visit:  Yes, see encounter dated 12/22/2020  Location of the provider (ex: office, home):  [place]  Name of any referring provider:  N/A  Names of all persons participating in the telemedicine service and their role in the encounter:  Sherrie Mustache, Nurse Practitioner, Carroll Kinds, CMA, and patient.   Time spent on call:  9 minutes with medical assitant

## 2020-12-23 ENCOUNTER — Ambulatory Visit (INDEPENDENT_AMBULATORY_CARE_PROVIDER_SITE_OTHER): Payer: Medicare Other | Admitting: Orthopaedic Surgery

## 2020-12-23 ENCOUNTER — Other Ambulatory Visit: Payer: Self-pay

## 2020-12-23 ENCOUNTER — Encounter: Payer: Self-pay | Admitting: Orthopaedic Surgery

## 2020-12-23 ENCOUNTER — Ambulatory Visit: Payer: Self-pay

## 2020-12-23 DIAGNOSIS — M25561 Pain in right knee: Secondary | ICD-10-CM | POA: Diagnosis not present

## 2020-12-23 DIAGNOSIS — G8929 Other chronic pain: Secondary | ICD-10-CM | POA: Diagnosis not present

## 2020-12-23 MED ORDER — METHYLPREDNISOLONE ACETATE 40 MG/ML IJ SUSP
80.0000 mg | INTRAMUSCULAR | Status: AC | PRN
  Administered 2020-12-23: 11:00:00 80 mg via INTRA_ARTICULAR

## 2020-12-23 MED ORDER — LIDOCAINE HCL 1 % IJ SOLN
5.0000 mL | INTRAMUSCULAR | Status: AC | PRN
  Administered 2020-12-23: 11:00:00 5 mL

## 2020-12-23 NOTE — Progress Notes (Signed)
Office Visit Note   Patient: [name]           Date of Birth: 01-24-1934           MRN: [id number] Visit Date: 12/23/2020              Requested by: [name], NP [place],  [place] [address] PCP: [name], NP   Assessment & Plan: Visit Diagnoses:  1. Chronic pain of right knee     Plan: 86 year old woman brought in by her daughter with long history of right knee pain.  X-rays demonstrate advanced arthritis in her knee.  Discussed the options with her daughter and her.  Would recommend a steroid shot today which she wants to go forward with.  Certainly if she got relief but it did not last long enough we could consider viscosupplementation.  Follow-Up Instructions: No follow-ups on file.   Orders:  Orders Placed This Encounter  Procedures   XR KNEE 3 VIEW RIGHT   No orders of the defined types were placed in this encounter.     Procedures: Large Joint Inj: R knee on 12/23/2020 10:59 AM Indications: pain and diagnostic evaluation Details: 25 G 1.5 in needle, anteromedial approach  Arthrogram: No  Medications: 80 mg methylPREDNISolone acetate 40 MG/ML; 5 mL lidocaine 1 % Outcome: tolerated well, no immediate complications Procedure, treatment alternatives, risks and benefits explained, specific risks discussed. Consent was given by the patient.      Clinical Data: No additional findings.   Subjective: Chief Complaint  Patient presents with   Right Knee - Pain  Patient presents today for right knee pain. She said that it has hurt for years. Her pain is all throughout her knee, and hurts all the time. She said that it does swell at times. No previous right knee surgery. She does not take anything for pain.     Review of Systems  All other systems reviewed and are negative.   Objective: Vital Signs: There were no vitals taken for this visit.  Physical Exam Constitutional:      Appearance: Normal appearance.   Pulmonary:     Effort: Pulmonary effort is normal.  Skin:    General: Skin is warm and dry.  Neurological:     Mental Status: Mental status is at baseline.  Psychiatric:        Mood and Affect: Mood normal.        Behavior: Behavior normal.    Ortho Exam Right knee : No effusion no cellulitis. Tender over medial and lateral joint line grinding with range of motion No varus or valgus instability Specialty Comments:  No specialty comments available.  Imaging: No results found.   PMFS History: Patient Active Problem List   Diagnosis Date Noted   Memory loss 12/05/2018   Mixed hyperlipidemia 12/05/2018   OAB (overactive bladder) 12/05/2018   B12 deficiency 12/05/2018   Pilar cyst 01/10/2012   Past Medical History:  Diagnosis Date   Cancer ([place])    rt breast   hx: breast cancer, right UIQ, invasive, receptor + her 2 - 09/12/2007    Family History  Problem Relation Age of Onset   Cancer Mother        breast (62), leukemia (1), colon (64),    Arthritis Mother    Osteoporosis Mother    Cancer Father        prostate   Dementia Father    Cancer Sister  breast (40s), colon (72), currently has alzheimers  ° Dementia Sister   ° Cancer Brother 73  °     colon, prostate, liver  ° Cancer Paternal Grandmother   °     pancreatic  ° Cancer Other 45  °     breast cancer, BRCA1 VUS and BRCA2 VUS  ° Cancer Other 50  °     breast cancer  ° Other Daughter   °     BRCA2 VUS  ° Cancer Cousin 60  °     breast cancer, BRCA2 varient   ° Cancer Cousin 50  °     breast  °  °Past Surgical History:  °Procedure Laterality Date  ° BREAST LUMPECTOMY    ° x2 right breast  ° CHOLECYSTECTOMY    ° Per [facility] New Patient Packet   ° °Social History  ° °Occupational History  ° Not on file  °Tobacco Use  ° Smoking status: Former  °  Packs/day: 1.00  °  Years: 10.00  °  Pack years: 10.00  °  Types: Cigarettes  °  Quit date: 01/03/1974  °  Years since quitting: 47.0  ° Smokeless tobacco: Never  ° Tobacco comments:   °  Quit at age 27   °Vaping Use  ° Vaping Use: Never used  °Substance and Sexual Activity  ° Alcohol use: No  ° Drug use: No  ° Sexual activity: Not Currently  ° ° ° ° ° ° °

## 2021-02-01 ENCOUNTER — Encounter: Payer: Self-pay | Admitting: Nurse Practitioner

## 2021-02-01 ENCOUNTER — Ambulatory Visit (INDEPENDENT_AMBULATORY_CARE_PROVIDER_SITE_OTHER): Payer: Medicare Other | Admitting: Nurse Practitioner

## 2021-02-01 ENCOUNTER — Other Ambulatory Visit: Payer: Self-pay

## 2021-02-01 VITALS — BP 138/80 | HR 75 | Temp 97.3°F | Ht 61.0 in | Wt 149.0 lb

## 2021-02-01 DIAGNOSIS — F39 Unspecified mood [affective] disorder: Secondary | ICD-10-CM | POA: Diagnosis not present

## 2021-02-01 DIAGNOSIS — N3281 Overactive bladder: Secondary | ICD-10-CM

## 2021-02-01 DIAGNOSIS — M1710 Unilateral primary osteoarthritis, unspecified knee: Secondary | ICD-10-CM | POA: Diagnosis not present

## 2021-02-01 MED ORDER — GEMTESA 75 MG PO TABS: 75.0000 mg | ORAL_TABLET | Freq: Every day | ORAL | 1 refills | Status: DC

## 2021-02-01 MED ORDER — MIRABEGRON ER 25 MG PO TB24: 25.0000 mg | ORAL_TABLET | Freq: Every day | ORAL | 1 refills | Status: DC

## 2021-02-01 NOTE — Progress Notes (Signed)
° ° °Careteam: °Patient Care Team: °,  K, NP as PCP - General (Geriatric Medicine) °Rubin, Peter, MD (Inactive) (Hematology and Oncology) °Manning, Matthew, MD (Radiation Oncology) °Streck, Christian, MD (General Surgery) ° °PLACE OF SERVICE:  °[facility]  °Advanced Directive information °Does Patient Have a Medical Advance Directive?: Yes, Type of Advance Directive: Healthcare Power of Attorney;Living will, Does patient want to make changes to medical advance directive?: No - Patient declined ° °No Known Allergies ° °Chief Complaint  °Patient presents with  ° Follow-up  °  6 week follow-up on overactive bladder, no difference in symptoms since medication. Patient denies receiving any vaccines since last visit. Discuss need for td/tdap and covid booster or post pone if patient refuses.   ° ° ° °HPI: Patient is a 86 y.o. female for follow up on overactive bladder.  °Myrbetriq was too expensive therefore vesicare was substituted to help with symptom management.  °Daughter reports she has not noticed a difference  °Pt thinks there may be some benefit.  °Has not seen urologist in the past.  °Continues to go often, several times in an hour.  ° °Went to orthopedic for injection- got steroid injection but was not beneficial. Plans to follow up regarding this.  °Review of Systems:  °Review of Systems  °Gastrointestinal:  Negative for abdominal pain and constipation.  °Genitourinary:  Positive for frequency. Negative for dysuria, flank pain, hematuria and urgency.  °Musculoskeletal:  Positive for joint pain.  °Psychiatric/Behavioral:  Positive for memory loss.   ° °Past Medical History:  °Diagnosis Date  ° Cancer ([facility])   ° rt breast  ° hx: breast cancer, right UIQ, invasive, receptor + her 2 - 09/12/2007  ° °Past Surgical History:  °Procedure Laterality Date  ° BREAST LUMPECTOMY    ° x2 right breast  ° CHOLECYSTECTOMY    ° Per [facility] New Patient Packet   ° °Social History: °  reports that she quit smoking about 47  years ago. Her smoking use included cigarettes. She has a 10.00 pack-year smoking history. She has never used smokeless tobacco. She reports that she does not drink alcohol and does not use drugs. ° °Family History  °Problem Relation Age of Onset  ° Cancer Mother   °     breast (76), leukemia (64), colon (80),   ° Arthritis Mother   ° Osteoporosis Mother   ° Cancer Father   °     prostate  ° Dementia Father   ° Cancer Sister   °     breast (40s), colon (72), currently has alzheimers  ° Dementia Sister   ° Cancer Brother 73  °     colon, prostate, liver  ° Cancer Paternal Grandmother   °     pancreatic  ° Cancer Other 45  °     breast cancer, BRCA1 VUS and BRCA2 VUS  ° Cancer Other 50  °     breast cancer  ° Other Daughter   °     BRCA2 VUS  ° Cancer Cousin 60  °     breast cancer, BRCA2 varient   ° Cancer Cousin 50  °     breast  ° ° °Medications: °Patient's Medications  °New Prescriptions  ° No medications on file  °Previous Medications  ° CALCIUM CARBONATE (CALCIUM 500 PO)    Take by mouth daily.  ° DONEPEZIL (ARICEPT) 5 MG TABLET    Take 1 tablet (5 mg total) by mouth at bedtime.  ° SOLIFENACIN (  VESICARE) 5 MG TABLET    Take 1 tablet (5 mg total) by mouth daily.  ° VITAMIN B-12 (CYANOCOBALAMIN) 1000 MCG TABLET    Take 1 tablet (1,000 mcg total) by mouth daily.  °Modified Medications  ° No medications on file  °Discontinued Medications  ° No medications on file  ° ° °Physical Exam: ° °Vitals:  ° 02/01/21 0852  °BP: 138/80  °Pulse: 75  °Temp: (!) 97.3 °F (36.3 °C)  °TempSrc: Temporal  °SpO2: 96%  °Weight: 149 lb (67.6 kg)  °Height: 5' 1" (1.549 m)  ° °Body mass index is 28.15 kg/m². °Wt Readings from Last 3 Encounters:  °02/01/21 149 lb (67.6 kg)  °12/09/20 150 lb 9.6 oz (68.3 kg)  °06/12/20 146 lb (66.2 kg)  ° ° °Physical Exam °Constitutional:   °   General: She is not in acute distress. °   Appearance: She is well-developed. She is not diaphoretic.  °HENT:  °   Head: Normocephalic and atraumatic.   °Cardiovascular:  °   Rate and Rhythm: Normal rate and regular rhythm.  °   Heart sounds: Normal heart sounds.  °Pulmonary:  °   Effort: Pulmonary effort is normal.  °   Breath sounds: Normal breath sounds.  °Abdominal:  °   General: Bowel sounds are normal.  °   Palpations: Abdomen is soft.  °Musculoskeletal:  °   Cervical back: Normal range of motion and neck supple.  °Skin: °   General: Skin is warm and dry.  °Neurological:  °   Mental Status: She is alert. Mental status is at baseline.  °Psychiatric:     °   Mood and Affect: Mood normal.  ° ° °Labs reviewed: °Basic Metabolic Panel: °Recent Labs  °  06/08/20 °0920 12/09/20 °1142  °NA 141 140  °K 4.4 4.3  °CL 106 105  °CO2 26 24  °GLUCOSE 99 82  °BUN 14 13  °CREATININE 0.83 0.82  °CALCIUM 9.7 9.5  ° °Liver Function Tests: °Recent Labs  °  06/08/20 °0920 12/09/20 °1142  °AST 20 20  °ALT 18 16  °BILITOT 0.4 0.6  °PROT 6.4 6.3  ° °No results for input(s): LIPASE, AMYLASE in the last 8760 hours. °No results for input(s): AMMONIA in the last 8760 hours. °CBC: °Recent Labs  °  06/08/20 °0920  °WBC 5.3  °NEUTROABS 4,086  °HGB 13.6  °HCT 41.7  °MCV 88.7  °PLT 274  ° °Lipid Panel: °Recent Labs  °  06/08/20 °0920 12/09/20 °1142  °CHOL 233* 218*  °HDL 86 89  °LDLCALC 132* 114*  °TRIG 62 63  °CHOLHDL 2.7 2.4  ° °TSH: °No results for input(s): TSH in the last 8760 hours. °A1C: °No results found for: HGBA1C ° ° °Assessment/Plan °1. Mood disorder (HCC) °Stable at this time. Continues with support of family ° °2. OAB (overactive bladder) °Ongoing, vesicare not effective. Will see if Gemtesa has better coverage than MYRBETRIQ.  °Otherwise will get urology referral.  °- Vibegron (GEMTESA) 75 MG TABS; Take 75 mg by mouth daily.  Dispense: 30 tablet; Refill: 1 ° °3. Primary osteoarthritis of knee, unspecified laterality °Ongoing, followed by ortho, plans to call for follow up since pain ongoing since injection. Continues brace for support.  ° ° ° °Return in about 6 months (around  08/01/2021). ° K. , AGNP ° °[facility] °[phone]  °

## 2021-02-01 NOTE — Patient Instructions (Addendum)
To start gemtesa for overactive bladder- STOP vesicare when starting gemtesa   Call office and let us know if she is able to get gemtesa or not (will get urology referral if not)

## 2021-03-08 ENCOUNTER — Telehealth: Payer: Self-pay

## 2021-03-08 ENCOUNTER — Telehealth: Payer: Self-pay | Admitting: Orthopaedic Surgery

## 2021-03-08 NOTE — Telephone Encounter (Signed)
Patient's daughter [name] called advised patient is wanting to get there gel injection in her right knee. The number to contact [name] is [phone] ?

## 2021-03-08 NOTE — Telephone Encounter (Signed)
Ok to pre cert

## 2021-03-08 NOTE — Telephone Encounter (Signed)
Called and notified her daughter that it has been ordered. ?

## 2021-03-08 NOTE — Telephone Encounter (Signed)
Please precert for right knee gel injections. ?[facility] patient. ? ?

## 2021-03-09 NOTE — Telephone Encounter (Signed)
Noted  

## 2021-03-12 ENCOUNTER — Telehealth: Payer: Self-pay

## 2021-03-12 NOTE — Telephone Encounter (Signed)
Pt daughter called asking for update on gel injections.  ? ?Cb [phone] ?

## 2021-03-12 NOTE — Telephone Encounter (Signed)
Talked with patient's daughter concerning gel injection process. ?

## 2021-03-12 NOTE — Telephone Encounter (Signed)
VOB submitted for Euflexxa, right knee BV pending 

## 2021-03-19 ENCOUNTER — Telehealth: Payer: Self-pay

## 2021-03-19 ENCOUNTER — Telehealth: Payer: Self-pay | Admitting: Orthopaedic Surgery

## 2021-03-19 NOTE — Telephone Encounter (Signed)
Submitted for Orthovisc, right knee due to Euflexxa not being a preferred product through patient's insurance. ?

## 2021-03-19 NOTE — Telephone Encounter (Signed)
Pt daughter called wanting to talk about the gel injections.  ? ?CB [phone] ?

## 2021-03-19 NOTE — Telephone Encounter (Signed)
Talked with patient's daughter concerning gel injection approval ? ?

## 2021-03-26 ENCOUNTER — Telehealth: Payer: Self-pay

## 2021-03-26 NOTE — Telephone Encounter (Signed)
Talked with [facility] and was advised that Orthovisc is not a preferred product.  The preferred products are Durolane, Glesyn-3, Synvisc and SynviscOne.   ?Submitted for Synvisc, right knee. ? ?

## 2021-03-26 NOTE — Telephone Encounter (Signed)
Approved for Synvisc, right knee.   ?[place] ?Medicare deductible has been met ?Covered at 100% through [place] after primary insurance pays. ?No Co-pay ?No PA required ? ?Appt. 04/13/2021 with Dr. Durward Fortes ? ?

## 2021-03-31 ENCOUNTER — Ambulatory Visit: Payer: PRIVATE HEALTH INSURANCE | Admitting: Orthopaedic Surgery

## 2021-04-04 ENCOUNTER — Other Ambulatory Visit: Payer: Self-pay | Admitting: Nurse Practitioner

## 2021-04-04 DIAGNOSIS — N3281 Overactive bladder: Secondary | ICD-10-CM

## 2021-04-12 ENCOUNTER — Other Ambulatory Visit: Payer: Self-pay | Admitting: Nurse Practitioner

## 2021-04-13 ENCOUNTER — Encounter: Payer: Self-pay | Admitting: Orthopaedic Surgery

## 2021-04-13 ENCOUNTER — Ambulatory Visit (INDEPENDENT_AMBULATORY_CARE_PROVIDER_SITE_OTHER): Payer: Medicare Other | Admitting: Orthopaedic Surgery

## 2021-04-13 DIAGNOSIS — M1711 Unilateral primary osteoarthritis, right knee: Secondary | ICD-10-CM

## 2021-04-13 MED ORDER — HYLAN G-F 20 16 MG/2ML IX SOSY
16.0000 mg | PREFILLED_SYRINGE | INTRA_ARTICULAR | Status: AC | PRN
  Administered 2021-04-13: 16 mg via INTRA_ARTICULAR

## 2021-04-13 NOTE — Progress Notes (Signed)
? ?Office Visit Note ?  ?Patient: [name]           ?Date of Birth: 02/20/1934           ?MRN: [id number] ?Visit Date: 04/13/2021 ?             ?Requested by: Eubanks, Jessica K, NP ?[address] ?[place],  [place] [address] ?PCP: Eubanks, Jessica K, NP ? ? ?Assessment & Plan: ?Visit Diagnoses:  ?1. Unilateral primary osteoarthritis, right knee   ? ? ?Plan: First Synvisc injection right knee.  Return weekly for the next 2 weeks to complete the series of 3 ? ?Follow-Up Instructions: Return in about 1 week (around 04/20/2021).  ? ?Orders:  ?No orders of the defined types were placed in this encounter. ? ?No orders of the defined types were placed in this encounter. ? ? ? ? Procedures: ?Large Joint Inj: R knee on 04/13/2021 1:31 PM ?Indications: joint swelling ?Details: 25 G 1.5 in needle ? ?Arthrogram: No ? ?Medications: 16 mg Hylan 16 MG/2ML ?Outcome: tolerated well, no immediate complications ?Procedure, treatment alternatives, risks and benefits explained, specific risks discussed. Consent was given by the patient. Immediately prior to procedure a time out was called to verify the correct patient, procedure, equipment, support staff and site/side marked as required. Patient was prepped and draped in the usual sterile fashion.  ? ? ? ? ?Clinical Data: ?No additional findings. ? ? ?Subjective: ?Chief Complaint  ?Patient presents with  ? Right Knee - Follow-up  ?  Synvisc  ?Patient presents today for the first Synvisc injection into her right knee. ? ?HPI ? ?Review of Systems ? ? ?Objective: ?Vital Signs: There were no vitals taken for this visit. ? ?Physical Exam ?Constitutional:   ?   Appearance: She is well-developed.  ?Pulmonary:  ?   Effort: Pulmonary effort is normal.  ?Skin: ?   General: Skin is warm and dry.  ?Neurological:  ?   Mental Status: She is alert and oriented to person, place, and time.  ?Psychiatric:     ?   Behavior: Behavior normal.  ? ? ?Ortho Exam right knee was not hot red warm or  swollen.  Predominant anterior medial joint pain.  Full extension flexion at least 100 degrees without any opening with varus or valgus stress.  No popliteal pain or mass ? ?Specialty Comments:  ?No specialty comments available. ? ?Imaging: ?No results found. ? ? ?PMFS History: ?Patient Active Problem List  ? Diagnosis Date Noted  ? Unilateral primary osteoarthritis, right knee 04/13/2021  ? Mood disorder (HCC) 02/01/2021  ? Memory loss 12/05/2018  ? Mixed hyperlipidemia 12/05/2018  ? OAB (overactive bladder) 12/05/2018  ? B12 deficiency 12/05/2018  ? Pilar cyst 01/10/2012  ? ?Past Medical History:  ?Diagnosis Date  ? Cancer (HCC)   ? rt breast  ? hx: breast cancer, right UIQ, invasive, receptor + her 2 - 09/12/2007  ?  ?Family History  ?Problem Relation Age of Onset  ? Cancer Mother   ?     breast (76), leukemia (64), colon (80),   ? Arthritis Mother   ? Osteoporosis Mother   ? Cancer Father   ?     prostate  ? Dementia Father   ? Cancer Sister   ?     breast (40s), colon (72), currently has alzheimers  ? Dementia Sister   ? Cancer Brother 73  ?     colon, prostate, liver  ? Cancer Paternal Grandmother   ?       pancreatic  ? Cancer Other 45  ?     breast cancer, BRCA1 VUS and BRCA2 VUS  ? Cancer Other 50  ?     breast cancer  ? Other Daughter   ?     BRCA2 VUS  ? Cancer Cousin 60  ?     breast cancer, BRCA2 varient   ? Cancer Cousin 50  ?     breast  ?  ?Past Surgical History:  ?Procedure Laterality Date  ? BREAST LUMPECTOMY    ? x2 right breast  ? CHOLECYSTECTOMY    ? Per [facility] New Patient Packet   ? ?Social History  ? ?Occupational History  ? Not on file  ?Tobacco Use  ? Smoking status: Former  ?  Packs/day: 1.00  ?  Years: 10.00  ?  Pack years: 10.00  ?  Types: Cigarettes  ?  Quit date: 01/03/1974  ?  Years since quitting: 47.3  ? Smokeless tobacco: Never  ? Tobacco comments:  ?  Quit at age 27   ?Vaping Use  ? Vaping Use: Never used  ?Substance and Sexual Activity  ? Alcohol use: No  ? Drug use: No  ? Sexual activity:  Not Currently  ? ? ? ? ? ? ?

## 2021-04-20 ENCOUNTER — Ambulatory Visit (INDEPENDENT_AMBULATORY_CARE_PROVIDER_SITE_OTHER): Payer: Medicare Other | Admitting: Orthopaedic Surgery

## 2021-04-20 ENCOUNTER — Encounter: Payer: Self-pay | Admitting: Orthopaedic Surgery

## 2021-04-20 DIAGNOSIS — M1711 Unilateral primary osteoarthritis, right knee: Secondary | ICD-10-CM

## 2021-04-20 MED ORDER — HYLAN G-F 20 16 MG/2ML IX SOSY
16.0000 mg | PREFILLED_SYRINGE | INTRA_ARTICULAR | Status: AC | PRN
  Administered 2021-04-20: 16 mg via INTRA_ARTICULAR

## 2021-04-20 NOTE — Progress Notes (Signed)
? ?Office Visit Note ?  ?Patient: [name]           ?Date of Birth: 07/14/1934           ?MRN: [id number] ?Visit Date: 04/20/2021 ?             ?Requested by: [name], NP ?[address] ?[place],   [address] ?PCP: [name], NP ? ? ?Assessment & Plan: ?Visit Diagnoses:  ?1. Unilateral primary osteoarthritis, right knee   ? ? ?Plan: Ms. Alegria had her second Synvisc injection her right knee today.  No problems with the first.  She will return next week for the third and final injection.  She is not sure she has had much relief yet ? ?Follow-Up Instructions: Return in about 1 week (around 04/27/2021).  ? ?Orders:  ?No orders of the defined types were placed in this encounter. ? ?No orders of the defined types were placed in this encounter. ? ? ? ? Procedures: ?Large Joint Inj: R knee on 04/20/2021 1:31 PM ?Indications: pain and joint swelling ?Details: 25 G 1.5 in needle ? ?Arthrogram: No ? ?Medications: 16 mg Hylan 16 MG/2ML ?Outcome: tolerated well, no immediate complications ?Procedure, treatment alternatives, risks and benefits explained, specific risks discussed. Consent was given by the patient. Immediately prior to procedure a time out was called to verify the correct patient, procedure, equipment, support staff and site/side marked as required. Patient was prepped and draped in the usual sterile fashion.  ? ? ? ? ?Clinical Data: ?No additional findings. ? ? ?Subjective: ?Chief Complaint  ?Patient presents with  ? Right Shoulder - Follow-up  ?  Synvisc #2  ?Patient presents today for the second Synvisc injection into her right knee.  ? ?HPI ? ?Review of Systems ? ? ?Objective: ?Vital Signs: There were no vitals taken for this visit. ? ?Physical Exam ? ?Ortho Exam knee with a very small bruise over the anterior medial compartment from her last week's injection.  Knee was not hot warm or red.  No effusion.  Some medial joint pain.  Full extension.  Walks without ambulatory  aid ? ?Specialty Comments:  ?No specialty comments available. ? ?Imaging: ?No results found. ? ? ?PMFS History: ?Patient Active Problem List  ? Diagnosis Date Noted  ? Unilateral primary osteoarthritis, right knee 04/13/2021  ? Mood disorder ([place]) 02/01/2021  ? Memory loss 12/05/2018  ? Mixed hyperlipidemia 12/05/2018  ? OAB (overactive bladder) 12/05/2018  ? B12 deficiency 12/05/2018  ? Pilar cyst 01/10/2012  ? ?Past Medical History:  ?Diagnosis Date  ? Cancer [facility])   ? rt breast  ? hx: breast cancer, right UIQ, invasive, receptor + her 2 - 09/12/2007  ?  ?Family History  ?Problem Relation Age of Onset  ? Cancer Mother   ?     breast (76), leukemia (64), colon (24),   ? Arthritis Mother   ? Osteoporosis Mother   ? Cancer Father   ?     prostate  ? Dementia Father   ? Cancer Sister   ?     breast (40s), colon (72), currently has alzheimers  ? Dementia Sister   ? Cancer Brother 47  ?     colon, prostate, liver  ? Cancer Paternal Grandmother   ?     pancreatic  ? Cancer Other 49  ?     breast cancer, BRCA1 VUS and BRCA2 VUS  ? Cancer Other 50  ?     breast  cancer  ? Other Daughter   ?     BRCA2 VUS  ? Cancer Cousin 60  ?     breast cancer, BRCA2 varient   ? Cancer Cousin 50  ?     breast  ?  ?Past Surgical History:  ?Procedure Laterality Date  ? BREAST LUMPECTOMY    ? x2 right breast  ? CHOLECYSTECTOMY    ? Per [facility] New Patient Packet   ? ?Social History  ? ?Occupational History  ? Not on file  ?Tobacco Use  ? Smoking status: Former  ?  Packs/day: 1.00  ?  Years: 10.00  ?  Pack years: 10.00  ?  Types: Cigarettes  ?  Quit date: 01/03/1974  ?  Years since quitting: 47.3  ? Smokeless tobacco: Never  ? Tobacco comments:  ?  Quit at age 70   ?Vaping Use  ? Vaping Use: Never used  ?Substance and Sexual Activity  ? Alcohol use: No  ? Drug use: No  ? Sexual activity: Not Currently  ? ? ? ? ? ? ?

## 2021-04-27 ENCOUNTER — Encounter: Payer: Self-pay | Admitting: Orthopaedic Surgery

## 2021-04-27 ENCOUNTER — Ambulatory Visit (INDEPENDENT_AMBULATORY_CARE_PROVIDER_SITE_OTHER): Payer: Medicare Other | Admitting: Orthopaedic Surgery

## 2021-04-27 DIAGNOSIS — G8929 Other chronic pain: Secondary | ICD-10-CM

## 2021-04-27 DIAGNOSIS — M1711 Unilateral primary osteoarthritis, right knee: Secondary | ICD-10-CM | POA: Diagnosis not present

## 2021-04-27 DIAGNOSIS — M25561 Pain in right knee: Secondary | ICD-10-CM

## 2021-04-27 MED ORDER — HYLAN G-F 20 16 MG/2ML IX SOSY
16.0000 mg | PREFILLED_SYRINGE | INTRA_ARTICULAR | Status: AC | PRN
  Administered 2021-04-27: 16 mg via INTRA_ARTICULAR

## 2021-04-27 NOTE — Progress Notes (Signed)
? ?Office Visit Note ?  ?Patient: [name]           ?Date of Birth: 1934-08-16           ?MRN: [id number] ?Visit Date: 04/27/2021 ?             ?Requested by: [name], NP ?[address] ?[place],  [place] [address] ?PCP: [name], NP ? ? ?Assessment & Plan: ?Visit Diagnoses: Arthritis right knee ? ?Plan: Patient comes in for her third injection of Synvisc into her right knee.  She is accompanied by her daughter.  She has not felt any relief yet but denies any increase in pain.  She understands that this may or may not help her.  May follow-up as needed. ? ?Follow-Up Instructions: No follow-ups on file.  ? ?Orders:  ?No orders of the defined types were placed in this encounter. ? ?No orders of the defined types were placed in this encounter. ? ? ? ? Procedures: ?Large Joint Inj on 04/27/2021 1:25 PM ?Indications: pain and diagnostic evaluation ?Details: 25 G 1.5 in needle ? ?Arthrogram: No ? ?Medications: 16 mg Hylan 16 MG/2ML ?Outcome: tolerated well, no immediate complications ?Procedure, treatment alternatives, risks and benefits explained, specific risks discussed. Consent was given by the patient.  ? ? ? ? ?Clinical Data: ?No additional findings. ? ? ?Subjective: ?No chief complaint on file. ?Patient presents today for the right Synvisc Injection. This is the third injection of the series.  ? ? ? ?Review of Systems  ?All other systems reviewed and are negative. ? ? ?Objective: ?Vital Signs: There were no vitals taken for this visit. ? ?Physical Exam ?Patient appears well ?Ortho Exam ?Right knee no redness no effusion compartments are soft ?Specialty Comments:  ?No specialty comments available. ? ?Imaging: ?No results found. ? ? ?PMFS History: ?Patient Active Problem List  ? Diagnosis Date Noted  ?? Unilateral primary osteoarthritis, right knee 04/13/2021  ?? Mood disorder ([place]) 02/01/2021  ?? Memory loss 12/05/2018  ?? Mixed hyperlipidemia 12/05/2018  ?? OAB (overactive bladder)  12/05/2018  ?? B12 deficiency 12/05/2018  ?? Pilar cyst 01/10/2012  ? ?Past Medical History:  ?Diagnosis Date  ?? Cancer [facility])   ? rt breast  ?? hx: breast cancer, right UIQ, invasive, receptor + her 2 - 09/12/2007  ?  ?Family History  ?Problem Relation Age of Onset  ?? Cancer Mother   ?     breast (76), leukemia (64), colon (60),   ?? Arthritis Mother   ?? Osteoporosis Mother   ?? Cancer Father   ?     prostate  ?? Dementia Father   ?? Cancer Sister   ?     breast (40s), colon (72), currently has alzheimers  ?? Dementia Sister   ?? Cancer Brother 85  ?     colon, prostate, liver  ?? Cancer Paternal Grandmother   ?     pancreatic  ?? Cancer Other 47  ?     breast cancer, BRCA1 VUS and BRCA2 VUS  ?? Cancer Other 50  ?     breast cancer  ?? Other Daughter   ?     BRCA2 VUS  ?? Cancer Cousin 60  ?     breast cancer, BRCA2 varient   ?? Cancer Cousin 50  ?     breast  ?  ?Past Surgical History:  ?Procedure Laterality Date  ?? BREAST LUMPECTOMY    ? x2 right breast  ?? CHOLECYSTECTOMY    ?  Per [facility] New Patient Packet   ? ?Social History  ? ?Occupational History  ?? Not on file  ?Tobacco Use  ?? Smoking status: Former  ?  Packs/day: 1.00  ?  Years: 10.00  ?  Pack years: 10.00  ?  Types: Cigarettes  ?  Quit date: 01/03/1974  ?  Years since quitting: 47.3  ?? Smokeless tobacco: Never  ?? Tobacco comments:  ?  Quit at age 21   ?Vaping Use  ?? Vaping Use: Never used  ?Substance and Sexual Activity  ?? Alcohol use: No  ?? Drug use: No  ?? Sexual activity: Not Currently  ? ? ? ? ? ? ?

## 2021-06-14 ENCOUNTER — Other Ambulatory Visit: Payer: Self-pay | Admitting: Nurse Practitioner

## 2021-07-14 ENCOUNTER — Telehealth: Payer: Medicare Other | Admitting: *Deleted

## 2021-07-14 NOTE — Telephone Encounter (Signed)
Gerlene Fee, NP  You 9 minutes ago (10:14 AM)   I am not familiar with this medication the cost is somewhere around $20,000 yearly. If her family wishes to try this medication will need to be seen by Janett Billow

## 2021-07-14 NOTE — Telephone Encounter (Signed)
[name], daughter called and stated that she heard on the News that there is a NEW shot out for Alzheimer to help slow down the Progression.   Daughter is wanting to know if patient is a good candidate for this shot/medication.    Please Advise. (Forwarded to [employer] due to [place] out of office)

## 2021-07-14 NOTE — Telephone Encounter (Signed)
[name] Notified and wanted message to be sent to Janett Billow so she could be aware.

## 2021-07-19 NOTE — Telephone Encounter (Signed)
[name] Notified and agreed.

## 2021-07-19 NOTE — Telephone Encounter (Signed)
Also medication requires neurology referral and frequent follow ups. We would not be able to prescribe this at our practice.

## 2021-08-17 ENCOUNTER — Other Ambulatory Visit: Payer: Self-pay | Admitting: Nurse Practitioner

## 2021-08-17 DIAGNOSIS — N3281 Overactive bladder: Secondary | ICD-10-CM

## 2021-09-28 DIAGNOSIS — H6123 Impacted cerumen, bilateral: Secondary | ICD-10-CM | POA: Diagnosis not present

## 2021-11-23 IMAGING — CT CT HEAD W/O CM
3 of 4 series · 15 of 47 positions shown, 18 images · non-contrast
Comparison: None.

CLINICAL DATA: 84-year-old female with memory loss for 6 months. No
known injury.

EXAM:
CT HEAD WITHOUT CONTRAST
TECHNIQUE: Contiguous axial images were obtained from the base of the skull
through the vertex without intravenous contrast.

[Series 2: head 5.00 hr40 s3 axial ibhc · axial · 0.42mm/px · z∈[-604,-474]mm · 9 of 32 slices shown, 12 images]
[im 3/32  brain]
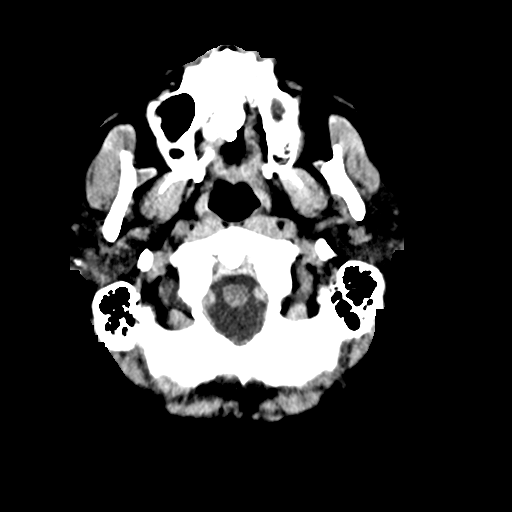
[im 3/32  bone]
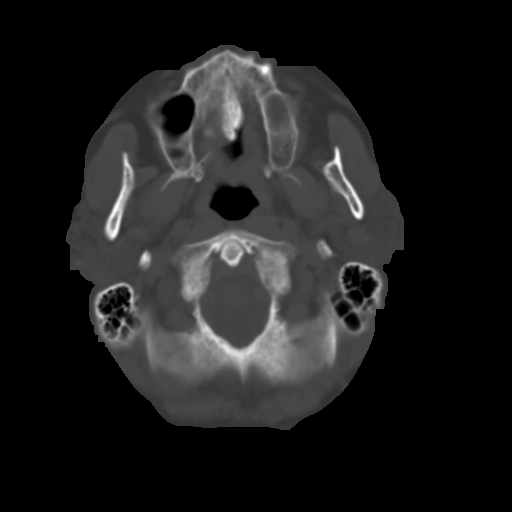
[im 7/32  brain]
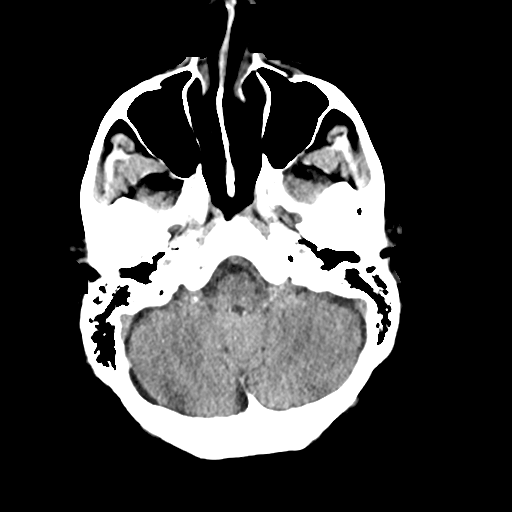
[im 9/32  brain]
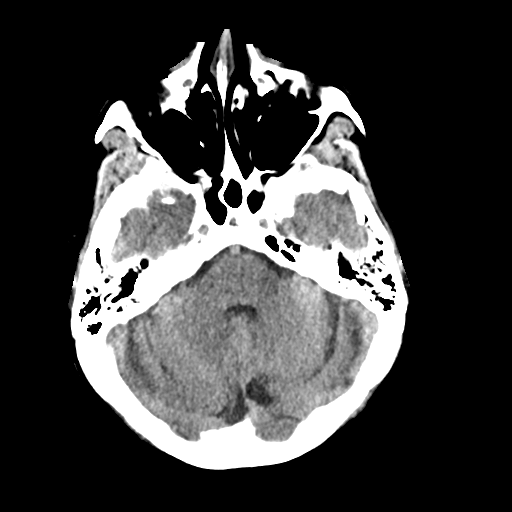
[im 14/32  brain]
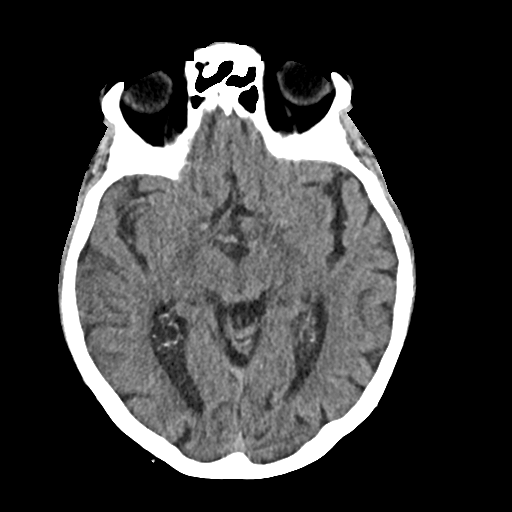
[im 16/32  brain]
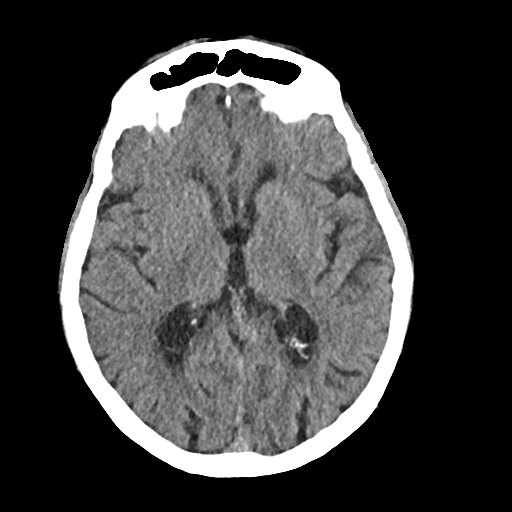
[im 16/32  bone]
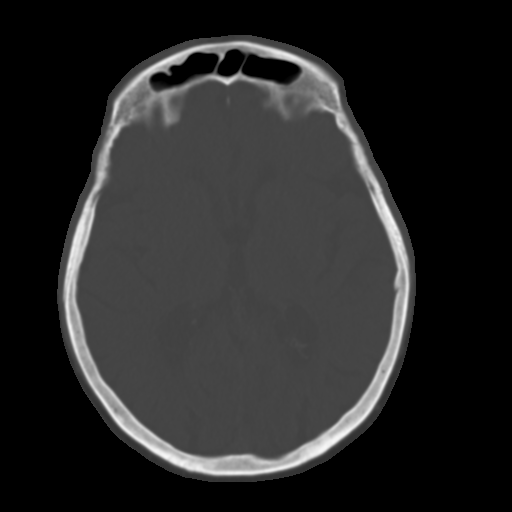
[im 18/32  brain]
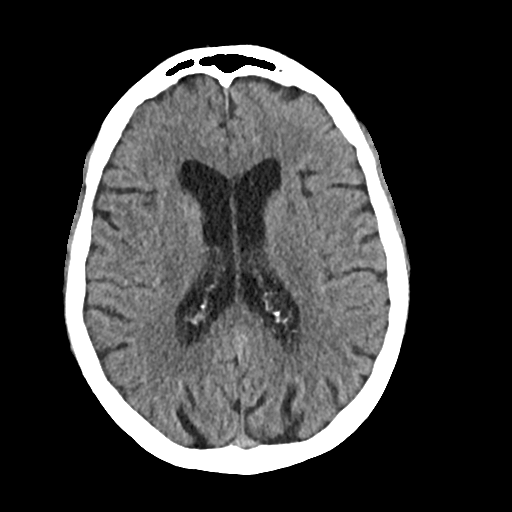
[im 23/32  brain]
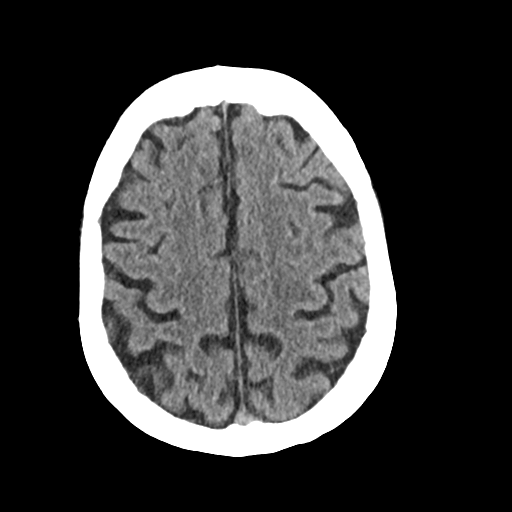
[im 25/32  brain]
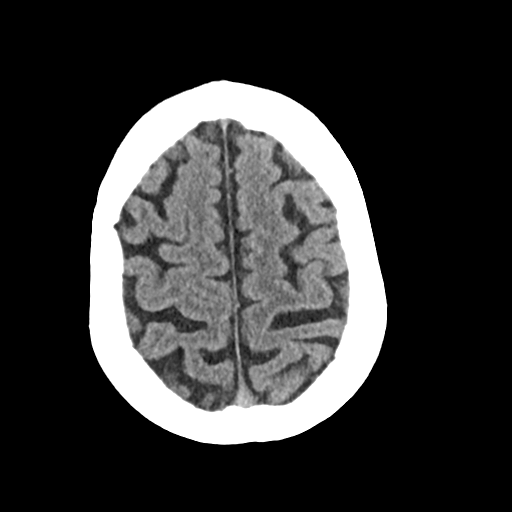
[im 29/32  brain]
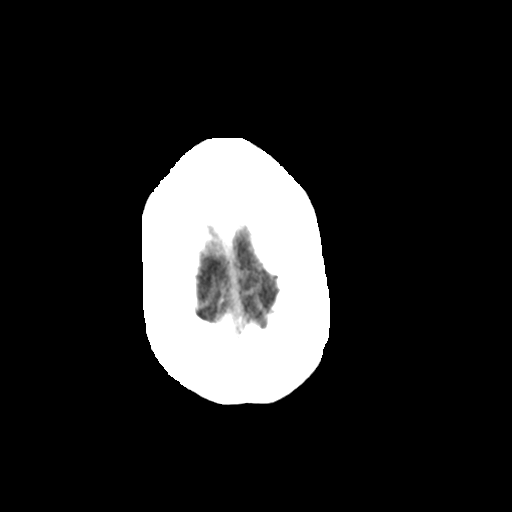
[im 29/32  bone]
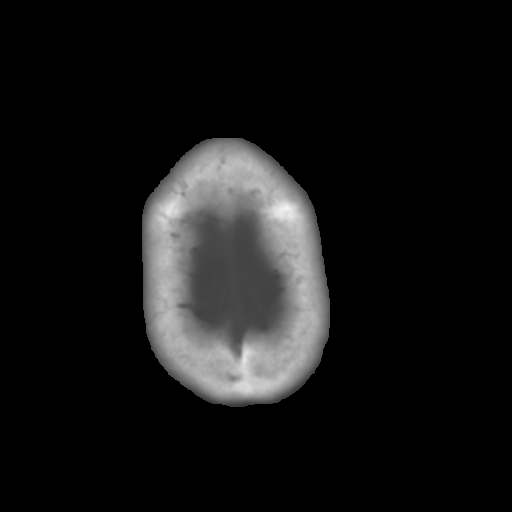

[Series 4: head 3.00 hr40 s3 sag · sagittal · 0.32mm/px · 3 of 58 slices shown]
[im 20/58  brain]
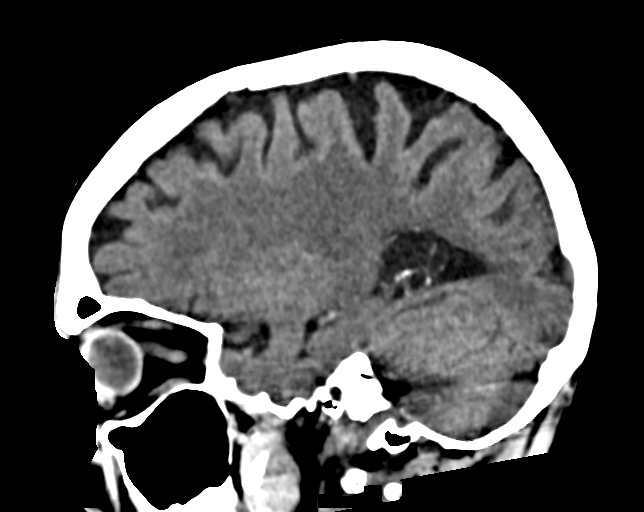
[im 29/58  brain]
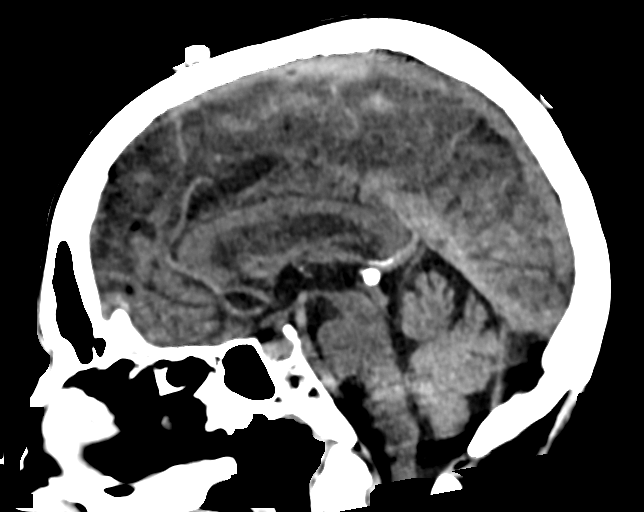
[im 39/58  brain]
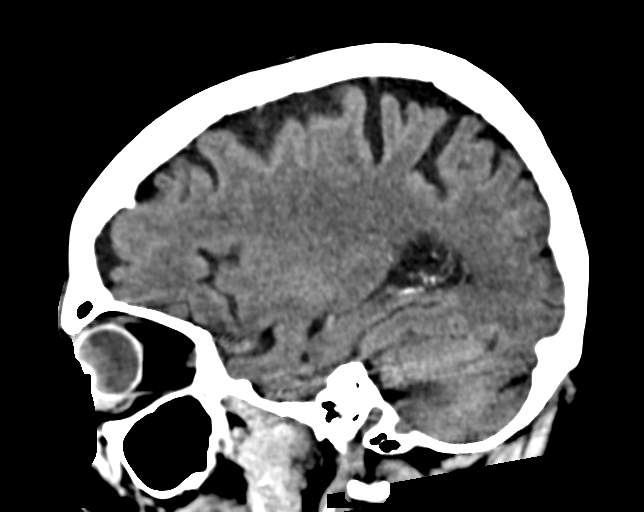

[Series 6: head 3.00 hr40 s3 cor · coronal · 0.33mm/px · 3 of 69 slices shown]
[im 23/69  brain]
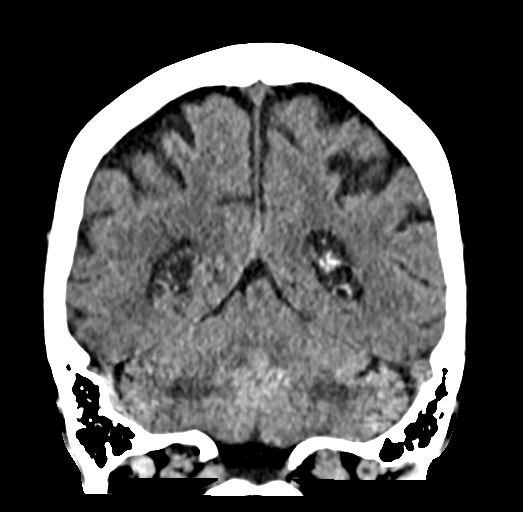
[im 31/69  brain]
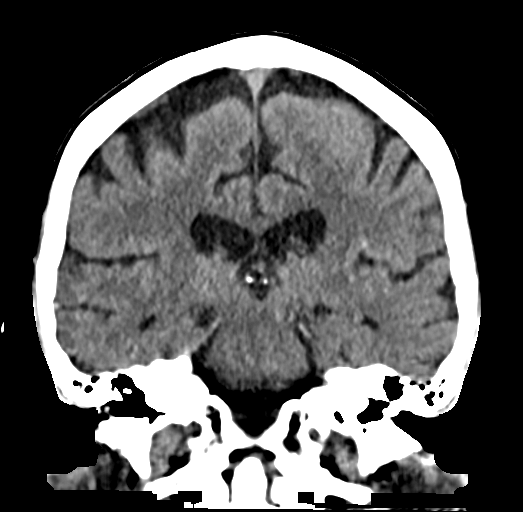
[im 38/69  brain]
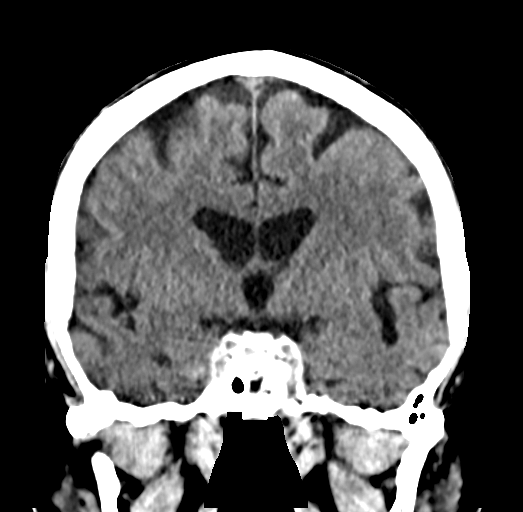

[15 of 47 positions shown; findings below may reference images not displayed]

FINDINGS: Brain: Cerebral volume is within normal limits for age. New No
midline shift, ventriculomegaly, mass effect, evidence of mass
lesion, intracranial hemorrhage or evidence of cortically based
acute infarction.

No cortical encephalomalacia is identified and gray-white matter
differentiation is within normal limits for age throughout the
brain. Incidental choroid plexus cysts (normal variant).

Vascular: Mild for age calcified atherosclerosis at the skull base.
No suspicious intracranial vascular hyperdensity.

Skull: Within normal limits.

Sinuses/Orbits: Minor mucosal thickening in the left maxillary
alveolar recess. Otherwise the paranasal sinuses and mastoids are
clear.

Other: Visualized orbit soft tissues are within normal limits. No
acute scalp soft tissue finding; benign appearing partially
calcified probable scalp sebaceous cyst at the vertex (series 6,
image 21), with additional smaller scattered similar calcified scalp
cysts.
IMPRESSION: Normal for age non contrast CT appearance of the brain.

## 2021-12-28 ENCOUNTER — Encounter: Payer: Medicare Other | Admitting: Nurse Practitioner

## 2022-01-04 ENCOUNTER — Encounter: Payer: Self-pay | Admitting: Nurse Practitioner

## 2022-01-05 ENCOUNTER — Ambulatory Visit (INDEPENDENT_AMBULATORY_CARE_PROVIDER_SITE_OTHER): Payer: Medicare Other | Admitting: Nurse Practitioner

## 2022-01-05 ENCOUNTER — Encounter: Payer: Self-pay | Admitting: Nurse Practitioner

## 2022-01-05 VITALS — BP 120/84 | HR 75 | Temp 97.5°F | Ht 61.0 in | Wt 156.0 lb

## 2022-01-05 DIAGNOSIS — Z23 Encounter for immunization: Secondary | ICD-10-CM | POA: Diagnosis not present

## 2022-01-05 DIAGNOSIS — Z0000 Encounter for general adult medical examination without abnormal findings: Secondary | ICD-10-CM

## 2022-01-05 DIAGNOSIS — E2839 Other primary ovarian failure: Secondary | ICD-10-CM

## 2022-01-05 NOTE — Patient Instructions (Signed)
[name] , Thank you for taking time to come for your Medicare Wellness Visit. I appreciate your ongoing commitment to your health goals. Please review the following plan we discussed and let me know if I can assist you in the future.   Screening recommendations/referrals: Colonoscopy aged out Mammogram aged out Bone Density DUe- ordered today- can call [facility] to schedule Recommended yearly ophthalmology/optometry visit for glaucoma screening and checkup Recommended yearly dental visit for hygiene and checkup  Vaccinations: Influenza vaccine- due annually in September/October- GIVEN today Pneumococcal vaccine up to date Tdap vaccine-.DUE- recommend to get at your local pharmacy Shingles vaccine up to date    Advanced directives: on file.   Conditions/risks identified: progressive memory loss, advance age.   Next appointment: yearly    Preventive Care 86 Years and Older, Female Preventive care refers to lifestyle choices and visits with your health care provider that can promote health and wellness. What does preventive care include? A yearly physical exam. This is also called an annual well check. Dental exams once or twice a year. Routine eye exams. Ask your health care provider how often you should have your eyes checked. Personal lifestyle choices, including: Daily care of your teeth and gums. Regular physical activity. Eating a healthy diet. Avoiding tobacco and drug use. Limiting alcohol use. Practicing safe sex. Taking low-dose aspirin every day. Taking vitamin and mineral supplements as recommended by your health care provider. What happens during an annual well check? The services and screenings done by your health care provider during your annual well check will depend on your age, overall health, lifestyle risk factors, and family history of disease. Counseling  Your health care provider may ask you questions about your: Alcohol use. Tobacco use. Drug  use. Emotional well-being. Home and relationship well-being. Sexual activity. Eating habits. History of falls. Memory and ability to understand (cognition). Work and work Statistician. Reproductive health. Screening  You may have the following tests or measurements: Height, weight, and BMI. Blood pressure. Lipid and cholesterol levels. These may be checked every 5 years, or more frequently if you are over 86 years old. Skin check. Lung cancer screening. You may have this screening every year starting at age 86 if you have a 30-pack-year history of smoking and currently smoke or have quit within the past 15 years. Fecal occult blood test (FOBT) of the stool. You may have this test every year starting at age 86. Flexible sigmoidoscopy or colonoscopy. You may have a sigmoidoscopy every 5 years or a colonoscopy every 10 years starting at age 86. Hepatitis C blood test. Hepatitis B blood test. Sexually transmitted disease (STD) testing. Diabetes screening. This is done by checking your blood sugar (glucose) after you have not eaten for a while (fasting). You may have this done every 1-3 years. Bone density scan. This is done to screen for osteoporosis. You may have this done starting at age 86. Mammogram. This may be done every 1-2 years. Talk to your health care provider about how often you should have regular mammograms. Talk with your health care provider about your test results, treatment options, and if necessary, the need for more tests. Vaccines  Your health care provider may recommend certain vaccines, such as: Influenza vaccine. This is recommended every year. Tetanus, diphtheria, and acellular pertussis (Tdap, Td) vaccine. You may need a Td booster every 10 years. Zoster vaccine. You may need this after age 86. Pneumococcal 13-valent conjugate (PCV13) vaccine. One dose is recommended after age 86. Pneumococcal polysaccharide (  PPSV23) vaccine. One dose is recommended after age  86. Talk to your health care provider about which screenings and vaccines you need and how often you need them. This information is not intended to replace advice given to you by your health care provider. Make sure you discuss any questions you have with your health care provider. Document Released: 01/16/2015 Document Revised: 09/09/2015 Document Reviewed: 10/21/2014 Elsevier Interactive Patient Education  2017 [place] Prevention in the Home Falls can cause injuries. They can happen to people of all ages. There are many things you can do to make your home safe and to help prevent falls. What can I do on the outside of my home? Regularly fix the edges of walkways and driveways and fix any cracks. Remove anything that might make you trip as you walk through a door, such as a raised step or threshold. Trim any bushes or trees on the path to your home. Use bright outdoor lighting. Clear any walking paths of anything that might make someone trip, such as rocks or tools. Regularly check to see if handrails are loose or broken. Make sure that both sides of any steps have handrails. Any raised decks and porches should have guardrails on the edges. Have any leaves, snow, or ice cleared regularly. Use sand or salt on walking paths during winter. Clean up any spills in your garage right away. This includes oil or grease spills. What can I do in the bathroom? Use night lights. Install grab bars by the toilet and in the tub and shower. Do not use towel bars as grab bars. Use non-skid mats or decals in the tub or shower. If you need to sit down in the shower, use a plastic, non-slip stool. Keep the floor dry. Clean up any water that spills on the floor as soon as it happens. Remove soap buildup in the tub or shower regularly. Attach bath mats securely with double-sided non-slip rug tape. Do not have throw rugs and other things on the floor that can make you trip. What can I do in the  bedroom? Use night lights. Make sure that you have a light by your bed that is easy to reach. Do not use any sheets or blankets that are too big for your bed. They should not hang down onto the floor. Have a firm chair that has side arms. You can use this for support while you get dressed. Do not have throw rugs and other things on the floor that can make you trip. What can I do in the kitchen? Clean up any spills right away. Avoid walking on wet floors. Keep items that you use a lot in easy-to-reach places. If you need to reach something above you, use a strong step stool that has a grab bar. Keep electrical cords out of the way. Do not use floor polish or wax that makes floors slippery. If you must use wax, use non-skid floor wax. Do not have throw rugs and other things on the floor that can make you trip. What can I do with my stairs? Do not leave any items on the stairs. Make sure that there are handrails on both sides of the stairs and use them. Fix handrails that are broken or loose. Make sure that handrails are as long as the stairways. Check any carpeting to make sure that it is firmly attached to the stairs. Fix any carpet that is loose or worn. Avoid having throw rugs at the top or  bottom of the stairs. If you do have throw rugs, attach them to the floor with carpet tape. Make sure that you have a light switch at the top of the stairs and the bottom of the stairs. If you do not have them, ask someone to add them for you. What else can I do to help prevent falls? Wear shoes that: Do not have high heels. Have rubber bottoms. Are comfortable and fit you well. Are closed at the toe. Do not wear sandals. If you use a stepladder: Make sure that it is fully opened. Do not climb a closed stepladder. Make sure that both sides of the stepladder are locked into place. Ask someone to hold it for you, if possible. Clearly mark and make sure that you can see: Any grab bars or  handrails. First and last steps. Where the edge of each step is. Use tools that help you move around (mobility aids) if they are needed. These include: Canes. Walkers. Scooters. Crutches. Turn on the lights when you go into a dark area. Replace any light bulbs as soon as they burn out. Set up your furniture so you have a clear path. Avoid moving your furniture around. If any of your floors are uneven, fix them. If there are any pets around you, be aware of where they are. Review your medicines with your doctor. Some medicines can make you feel dizzy. This can increase your chance of falling. Ask your doctor what other things that you can do to help prevent falls. This information is not intended to replace advice given to you by your health care provider. Make sure you discuss any questions you have with your health care provider. Document Released: 10/16/2008 Document Revised: 05/28/2015 Document Reviewed: 01/24/2014 Elsevier Interactive Patient Education  2017 [employer].

## 2022-01-05 NOTE — Progress Notes (Signed)
Subjective:   [name] is a 88 y.o. female who presents for Medicare Annual (Subsequent) preventive examination.  Review of Systems     Cardiac Risk Factors include: advanced age (>17mn, >>73women);sedentary lifestyle     Objective:    Today's Vitals   01/04/22 1443  BP: 120/84  Pulse: 75  Temp: (!) 97.5 F (36.4 C)  TempSrc: Temporal  SpO2: 99%  Weight: 156 lb (70.8 kg)  Height: _0  (1.549 m)   Body mass index is 29.48 kg/m.     01/05/2022   10:16 AM 02/01/2021    8:55 AM 12/22/2020   10:39 AM 12/09/2020   11:17 AM 06/12/2020   11:15 AM 12/17/2019    1:10 PM 06/05/2019    3:39 PM  Advanced Directives  Does Patient Have a Medical Advance Directive? _1  Yes Yes  Type of AParamedicof AKelly RidgeLiving will HMount Healthy HeightsLiving will Healthcare Power of AShannon Cityof [place] will Healthcare Power of [place] Does patient want to make changes to medical advance directive? No - Patient declined No - Patient declined No - Patient declined No - Patient declined  No - Patient declined No - Patient declined  Copy of [place]in Chart? Yes - validated most recent copy scanned in chart (See row information) Yes - validated most recent copy scanned in chart (See row information) Yes - validated most recent copy scanned in chart (See row information) Yes - validated most recent copy scanned in chart (See row information) Yes - validated most recent copy scanned in chart (See row information) Yes - validated most recent copy scanned in chart (See row information) Yes - validated most recent copy scanned in chart (See row information)  Would patient like information on creating a medical advance directive?     No - Patient declined      Current Medications (verified) Outpatient Encounter Medications as of 01/05/2022  Medication  Sig   Calcium Carbonate (CALCIUM 500 PO) Take by mouth daily.   donepezil (ARICEPT) 5 MG tablet TAKE 1 TABLET(5 MG) BY MOUTH AT BEDTIME   Vibegron (GEMTESA) 75 MG TABS TAKE 1 TABLET BY MOUTH DAILY   vitamin B-12 (CYANOCOBALAMIN) 1000 MCG tablet Take 1 tablet (1,000 mcg total) by mouth daily.   No facility-administered encounter medications on file as of 01/05/2022.    Allergies (verified) Patient has no known allergies.   History: Past Medical History:  Diagnosis Date   [place]    [place] breast   hx: breast cancer, right UIQ, invasive, receptor + her 2 - 09/12/2007   Past Surgical History:  Procedure Laterality Date   BREAST LUMPECTOMY     x2 right breast   CHOLECYSTECTOMY     [place]    Family History  Problem Relation Age of Onset   Cancer Mother        breast ((63, leukemia ((72, colon ((63,    Arthritis Mother    Osteoporosis Mother    Cancer Father        prostate   Dementia Father    Cancer Sister        breast (457s, colon (72), currently has alzheimers   Dementia Sister    Cancer Brother 748      colon, prostate, liver   Cancer Paternal Grandmother        pancreatic  Cancer Other 45       breast cancer, BRCA1 VUS and BRCA2 VUS   Cancer Other 50       breast cancer   Other Daughter        BRCA2 VUS   Cancer Cousin 30       breast cancer, BRCA2 varient    Cancer Cousin 21       breast   Social History   Socioeconomic History   Marital status: Single    Spouse name: Not on file   Number of children: Not on file   Years of education: Not on file   Highest education level: Not on file  Occupational History   Not on file  Tobacco Use   Smoking status: Former    Packs/day: 1.00    Years: 10.00    Total pack years: 10.00    Types: Cigarettes    Quit date: 01/03/1974    Years since quitting: 48.0   Smokeless tobacco: Never   Tobacco comments:    Quit at age 46   Vaping Use   Vaping Use: Never used  Substance and Sexual Activity    Alcohol use: No   Drug use: No   Sexual activity: Not Currently  Other Topics Concern   Not on file  Social History Narrative   Diet: N/A      Caffeine: Soda, coffee, and tea       Married, if yes what year: Divorced       Do you live in a house, apartment, assisted living, condo, trailer, ect: House, 2 stories, 2 persons      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 No   Do you have difficulty preparing food or eating? No   Do you have difficulty managing your medications? No   Do you have difficulty managing your finances? No   Do you have difficulty affording your medications? No   Social Determinants of Radio broadcast assistant Strain: Not on file  Food Insecurity: Not on file  Transportation Needs: Not on file  Physical Activity: Not on file  Stress: Not on file  Social Connections: Not on file    Tobacco Counseling Counseling given: Not Answered Tobacco comments: Quit at age 66    Clinical Intake:  Pre-visit preparation completed: Yes  Pain : No/denies pain     BMI - recorded: 29 Diabetes: No  How often do you need to have someone help you when you read instructions, pamphlets, or other written materials from your doctor or pharmacy?: 1 - Never  Diabetic?no         Activities of Daily Living    01/05/2022   10:35 AM 01/05/2022   10:33 AM  In your present state of health, do you have any difficulty performing the following activities:  Hearing? 0   Vision? 0   Difficulty concentrating or making decisions? 0   Walking or climbing stairs?  1  Comment  trips frequently, knee pain  Dressing or bathing?  0  Doing errands, shopping?  0  Preparing Food and eating ?  N  Using the Toilet?  N  In the past six  months, have you accidently leaked urine?  N   Do you have problems with loss of bowel control?  N  Managing your Medications? N   Comment daughter helps   Managing your Finances? N   Comment daughter helps   Housekeeping or managing your Housekeeping? N     Patient Care Team: [name], NP as PCP - General (Geriatric Medicine) Eston Esters, MD (Inactive) (Hematology and Oncology) Tyler Pita, MD (Radiation Oncology) Neldon Mc, MD (Inactive) (General Surgery)  Indicate any recent Medical Services you may have received from other than [facility] providers in the past year (date may be approximate).     Assessment:   This is a routine wellness examination for [place].  Hearing/Vision screen Hearing Screening - Comments:: No hearing issues  Vision Screening - Comments:: Last eye exam less than 12 months ago, Dr.Oman   Dietary issues and exercise activities discussed: Current Exercise Habits: The patient does not participate in regular exercise at present   Goals Addressed   None   Depression Screen    01/05/2022   10:14 AM 12/22/2020   10:37 AM 06/12/2020   11:14 AM 12/17/2019    1:08 PM 06/05/2019    3:38 PM 12/14/2018    2:24 PM 06/26/2018    9:26 AM  PHQ 2/9 Scores  PHQ - 2 Score 0 0 0 0 0 0 0    Fall Risk    01/05/2022   10:13 AM 12/22/2020   10:38 AM 12/09/2020   11:16 AM 06/12/2020   11:14 AM 12/17/2019    1:10 PM  Fall Risk   Falls in the past year? 1 1 0 1 0  Number falls in past yr: 1 1 0 0 0  Injury with Fall? 0 0 0 0 0  Risk for fall due to : History of fall(s) History of fall(s) No Fall Risks    Follow up Falls evaluation completed Falls evaluation completed Falls evaluation completed      [place]:  Any stairs in or around the home? Yes  If so, are there any without handrails? No  Home free of loose throw rugs in walkways, pet beds, electrical cords, etc? Yes  Adequate lighting in your home to reduce risk of falls? Yes   ASSISTIVE DEVICES  UTILIZED TO PREVENT FALLS:  Life alert? No  Use of a cane, walker or w/c? No  Grab bars in the bathroom? No  Shower chair or bench in shower? No  Elevated toilet seat or a handicapped toilet? No   TIMED UP AND GO:  Was the test performed? No .    Gait steady and fast without use of assistive device  Cognitive Function:    01/05/2022   10:18 AM 12/13/2019    2:37 PM 06/26/2018    9:36 AM  MMSE - Mini Mental State Exam  Orientation to time _0 Orientation to Place _1 Registration _2 Attention/ Calculation _3 Recall _4 Language- name 2 objects _5 Language- repeat _6 Language- follow 3 step command _7 Language- read & follow direction _8 Write a sentence _9 Copy design 1 0 0  Total score _10 12/22/2020   10:40 AM 12/14/2018    2:24 PM  6CIT Screen  What Year? 0  points 0 points  What month? 0 points 0 points  What time? 0 points 3 points  Count back from 20 0 points 0 points  Months in reverse 0 points 0 points  Repeat phrase 10 points 0 points  Total Score 10 points 3 points    Immunizations Immunization History  Administered Date(s) Administered   Fluad Quad(high Dose 65+) 12/13/2019, 12/09/2020, 01/05/2022   Influenza, High Dose Seasonal PF 12/05/2014, 10/18/2017   Influenza, Seasonal, Injecte, Preservative Fre 11/09/2015   Influenza-Unspecified 10/04/2018   PFIZER(Purple Top)SARS-COV-2 Vaccination 01/24/2019, 02/14/2019, 12/21/2019   Pneumococcal Conjugate-13 10/27/2017   Pneumococcal Polysaccharide-23 12/05/2014   Zoster Recombinat (Shingrix) 12/14/2017, 09/26/2018   Zoster, Live 01/16/2014    TDAP status: Due, Education has been provided regarding the importance of this vaccine. Advised may receive this vaccine at local pharmacy or Health Dept. Aware to provide a copy of the vaccination record if obtained from local pharmacy or Health Dept. Verbalized acceptance and understanding.  Flu Vaccine status:  Completed at today's visit  Pneumococcal vaccine status: Up to date  Covid-19 vaccine status: Information provided on how to obtain vaccines.   Qualifies for Shingles Vaccine? Yes   Zostavax completed No   Shingrix Completed?: Yes  Screening Tests Health Maintenance  Topic Date Due   DTaP/Tdap/Td (1 - Tdap) Never done   COVID-19 Vaccine (4 - 2023-24 season) 09/03/2021   Medicare Annual Wellness (AWV)  01/06/2023   Pneumonia Vaccine 37+ Years old  Completed   INFLUENZA VACCINE  Completed   DEXA SCAN  Completed   Zoster Vaccines- Shingrix  Completed   HPV VACCINES  Aged Out    Health Maintenance  Health Maintenance Due  Topic Date Due   DTaP/Tdap/Td (1 - Tdap) Never done   COVID-19 Vaccine (4 - 2023-24 season) 09/03/2021    Colorectal cancer screening: No longer required.   Mammogram status: No longer required due to aged out .  Bone Density status: Completed 02/07/20. Results reflect: Bone density results: OSTEOPENIA. Repeat every 2 years.  Lung Cancer Screening: (Low Dose CT Chest recommended if Age 85-80 years, 30 pack-year currently smoking OR have quit w/in 15years.) does not qualify.   Lung Cancer Screening Referral: na  Additional Screening:  Hepatitis C Screening: does not qualify; Completed  Vision Screening: Recommended annual ophthalmology exams for early detection of glaucoma and other disorders of the eye Is the patient up to date with their annual eye exam?  Yes  Who is the provider or what is the name of the office in which the patient attends annual eye exams? [place] If pt is not established with a provider, would they like to be referred to a provider to establish care? No .   Dental Screening: Recommended annual dental exams for proper oral hygiene  Community Resource Referral / Chronic Care Management: CRR required this visit?  No   CCM required this visit?  No      Plan:     I have personally reviewed and noted the following in the patient's  chart:   Medical and social history Use of alcohol, tobacco or illicit drugs  Current medications and supplements including opioid prescriptions. Patient is not currently taking opioid prescriptions. Functional ability and status Nutritional status Physical activity Advanced directives List of other physicians Hospitalizations, surgeries, and ER visits in previous 12 months Vitals Screenings to include cognitive, depression, and falls Referrals and appointments  In addition, I have reviewed and discussed with patient certain preventive protocols, quality metrics, and best  practice recommendations. A written personalized care plan for preventive services as well as general preventive health recommendations were provided to patient.     [name], NP   01/05/2022   Place of service: [facility]

## 2022-02-11 ENCOUNTER — Ambulatory Visit: Payer: Medicare Other | Admitting: Nurse Practitioner

## 2022-02-11 ENCOUNTER — Encounter: Payer: Self-pay | Admitting: Nurse Practitioner

## 2022-02-14 ENCOUNTER — Encounter: Payer: Self-pay | Admitting: Nurse Practitioner

## 2022-02-14 ENCOUNTER — Ambulatory Visit (INDEPENDENT_AMBULATORY_CARE_PROVIDER_SITE_OTHER): Payer: Medicare Other | Admitting: Nurse Practitioner

## 2022-02-14 VITALS — BP 120/88 | HR 65 | Temp 97.6°F | Ht 61.0 in | Wt 155.4 lb

## 2022-02-14 DIAGNOSIS — N3281 Overactive bladder: Secondary | ICD-10-CM

## 2022-02-14 DIAGNOSIS — E538 Deficiency of other specified B group vitamins: Secondary | ICD-10-CM | POA: Diagnosis not present

## 2022-02-14 DIAGNOSIS — F39 Unspecified mood [affective] disorder: Secondary | ICD-10-CM

## 2022-02-14 DIAGNOSIS — E782 Mixed hyperlipidemia: Secondary | ICD-10-CM | POA: Diagnosis not present

## 2022-02-14 DIAGNOSIS — M1710 Unilateral primary osteoarthritis, unspecified knee: Secondary | ICD-10-CM | POA: Diagnosis not present

## 2022-02-14 NOTE — Progress Notes (Signed)
Careteam: Patient Care Team: [name], NP as PCP - General (Geriatric Medicine) Eston Esters, MD (Inactive) (Hematology and Oncology) Tyler Pita, MD (Radiation Oncology) Neldon Mc, MD (Inactive) (General Surgery)  PLACE OF SERVICE:  [place] Directive information Does Patient Have a Medical Advance Directive?: Yes, Type of Advance Directive: [place];Living will, Does patient want to make changes to medical advance directive?: No - Patient declined  No Known Allergies  Chief Complaint  Patient presents with   Medical Management of Chronic Issues    12 month follow-up. Discuss need for td/tdap and covid boosters or post pone if patient refuses or is not a candidate. [facility] verified.     HPI: Patient is a 88 y.o. female for routine follow up.   States she is doing good for a "young lady"  She has stopped the gemtesa because it was not effective. She took for 6 months.   Memory loss- stable per daughter- has help with her friend who lives with her.   No routine exercise. Daughter reports she is sedentary .  Review of Systems:  Review of Systems  Constitutional:  Negative for chills, fever and weight loss.  HENT:  Negative for tinnitus.   Respiratory:  Negative for cough, sputum production and shortness of breath.   Cardiovascular:  Negative for chest pain, palpitations and leg swelling.  Gastrointestinal:  Negative for abdominal pain, constipation, diarrhea and heartburn.  Genitourinary:  Positive for frequency. Negative for dysuria and urgency.  Musculoskeletal:  Negative for back pain, falls, joint pain and myalgias.  Skin: Negative.   Neurological:  Negative for dizziness and headaches.  Psychiatric/Behavioral:  Positive for memory loss. Negative for depression. The patient does not have insomnia.     Past Medical History:  Diagnosis Date   Cancer ([place])    rt breast   hx: breast cancer, right UIQ, invasive,  receptor + her 2 - 09/12/2007   Past Surgical History:  Procedure Laterality Date   BREAST LUMPECTOMY     x2 right breast   CHOLECYSTECTOMY     Per [facility] New Patient Packet    Social History:   reports that she quit smoking about 48 years ago. Her smoking use included cigarettes. She has a 10.00 pack-year smoking history. She has never used smokeless tobacco. She reports that she does not drink alcohol and does not use drugs.  Family History  Problem Relation Age of Onset   Cancer Mother        breast (56), leukemia (62), colon (48),    Arthritis Mother    Osteoporosis Mother    Cancer Father        prostate   Dementia Father    Cancer Sister        breast (5s), colon (72), currently has alzheimers   Dementia Sister    Cancer Brother 4       colon, prostate, liver   Cancer Paternal Grandmother        pancreatic   Cancer Other 74       breast cancer, BRCA1 VUS and BRCA2 VUS   Cancer Other 24       breast cancer   Other Daughter        BRCA2 VUS   Cancer Cousin 19       breast cancer, BRCA2 varient    Cancer Cousin 50       breast    Medications: Patient's Medications  New Prescriptions  No medications on file  Previous Medications   CALCIUM CARBONATE (CALCIUM 500 PO)    Take by mouth daily.   DONEPEZIL (ARICEPT) 5 MG TABLET    TAKE 1 TABLET(5 MG) BY MOUTH AT BEDTIME   MULTIPLE VITAMINS-MINERALS (CENTRUM ADULT PO)    Take 1 tablet by mouth daily.   VITAMIN B-12 (CYANOCOBALAMIN) 1000 MCG TABLET    Take 1 tablet (1,000 mcg total) by mouth daily.  Modified Medications   No medications on file  Discontinued Medications   VIBEGRON (GEMTESA) 75 MG TABS    TAKE 1 TABLET BY MOUTH DAILY    Physical Exam:  Vitals:   02/11/22 1648  BP: 120/88  Pulse: 65  Temp: 97.6 F (36.4 C)  SpO2: 96%  Weight: 155 lb 6.4 oz (70.5 kg)  Height: 5' 1"$  (1.549 m)   Body mass index is 29.36 kg/m. Wt Readings from Last 3 Encounters:  02/11/22 155 lb 6.4 oz (70.5 kg)  01/04/22 156  lb (70.8 kg)  02/01/21 149 lb (67.6 kg)    Physical Exam Constitutional:      General: She is not in acute distress.    Appearance: She is well-developed. She is not diaphoretic.  HENT:     Head: Normocephalic and atraumatic.     Mouth/Throat:     Pharynx: No oropharyngeal exudate.  Eyes:     Conjunctiva/sclera: Conjunctivae normal.     Pupils: Pupils are equal, round, and reactive to light.  Cardiovascular:     Rate and Rhythm: Normal rate and regular rhythm.     Heart sounds: Normal heart sounds.  Pulmonary:     Effort: Pulmonary effort is normal.     Breath sounds: Normal breath sounds.  Abdominal:     General: Bowel sounds are normal.     Palpations: Abdomen is soft.  Musculoskeletal:     Cervical back: Normal range of motion and neck supple.     Right lower leg: No edema.     Left lower leg: No edema.  Skin:    General: Skin is warm and dry.  Neurological:     Mental Status: She is alert.  Psychiatric:        Mood and Affect: Mood normal.     Labs reviewed: Basic Metabolic Panel: No results for input(s): "NA", "K", "CL", "CO2", "GLUCOSE", "BUN", "CREATININE", "CALCIUM", "MG", "PHOS", "TSH" in the last 8760 hours. Liver Function Tests: No results for input(s): "AST", "ALT", "ALKPHOS", "BILITOT", "PROT", "ALBUMIN" in the last 8760 hours. No results for input(s): "LIPASE", "AMYLASE" in the last 8760 hours. No results for input(s): "AMMONIA" in the last 8760 hours. CBC: No results for input(s): "WBC", "NEUTROABS", "HGB", "HCT", "MCV", "PLT" in the last 8760 hours. Lipid Panel: No results for input(s): "CHOL", "HDL", "LDLCALC", "TRIG", "CHOLHDL", "LDLDIRECT" in the last 8760 hours. TSH: No results for input(s): "TSH" in the last 8760 hours. A1C: No results found for: "HGBA1C"   Assessment/Plan 1. Mood disorder ([place]) Stable at this time  2. OAB (overactive bladder) -off gemtesa due to not being effective,continues to have very increased frequency. -  Ambulatory referral to Urology for further evaluation and recommendations  3. Mixed hyperlipidemia -continue dietary modifications.  - Lipid panel - COMPLETE METABOLIC PANEL WITH GFR - CBC with Differential/Platelet  4. Primary osteoarthritis of knee, unspecified laterality Ongoing, previously followed by ortho and injections were not effective.  -continue tylenol PRN  5. B12 deficiency -continues on supplement.  - Vitamin B12   Return in about 6  months (around 08/15/2022) for routine follow up .  Carlos American. Oliver, [facility] [phone]

## 2022-02-15 LAB — CBC WITH DIFFERENTIAL/PLATELET
Absolute Monocytes: 605 cells/uL (ref 200–950)
Basophils Absolute: 39 cells/uL (ref 0–200)
Basophils Relative: 0.6 %
Eosinophils Absolute: 78 cells/uL (ref 15–500)
Eosinophils Relative: 1.2 %
HCT: 40 % (ref 35.0–45.0)
Hemoglobin: 13.4 g/dL (ref 11.7–15.5)
Lymphs Abs: 1112 cells/uL (ref 850–3900)
MCH: 29.4 pg (ref 27.0–33.0)
MCHC: 33.5 g/dL (ref 32.0–36.0)
MCV: 87.7 fL (ref 80.0–100.0)
MPV: 10.2 fL (ref 7.5–12.5)
Monocytes Relative: 9.3 %
Neutro Abs: 4667 cells/uL (ref 1500–7800)
Neutrophils Relative %: 71.8 %
Platelets: 278 10*3/uL (ref 140–400)
RBC: 4.56 10*6/uL (ref 3.80–5.10)
RDW: 12.5 % (ref 11.0–15.0)
Total Lymphocyte: 17.1 %
WBC: 6.5 10*3/uL (ref 3.8–10.8)

## 2022-02-15 LAB — COMPLETE METABOLIC PANEL WITH GFR
AG Ratio: 1.5 (calc) (ref 1.0–2.5)
ALT: 21 U/L (ref 6–29)
AST: 27 U/L (ref 10–35)
Albumin: 4 g/dL (ref 3.6–5.1)
Alkaline phosphatase (APISO): 84 U/L (ref 37–153)
BUN: 11 mg/dL (ref 7–25)
CO2: 27 mmol/L (ref 20–32)
Calcium: 9.8 mg/dL (ref 8.6–10.4)
Chloride: 105 mmol/L (ref 98–110)
Creat: 0.77 mg/dL (ref 0.60–0.95)
Globulin: 2.6 g/dL (calc) (ref 1.9–3.7)
Glucose, Bld: 89 mg/dL (ref 65–99)
Potassium: 4.4 mmol/L (ref 3.5–5.3)
Sodium: 141 mmol/L (ref 135–146)
Total Bilirubin: 0.6 mg/dL (ref 0.2–1.2)
Total Protein: 6.6 g/dL (ref 6.1–8.1)
eGFR: 76 mL/min/{1.73_m2} (ref >=60)

## 2022-02-15 LAB — LIPID PANEL
Cholesterol: 248 mg/dL — ABNORMAL HIGH (ref <200)
HDL: 96 mg/dL (ref >=50)
LDL Cholesterol (Calc): 137 mg/dL (calc) — ABNORMAL HIGH
Non-HDL Cholesterol (Calc): 152 mg/dL (calc) — ABNORMAL HIGH (ref <130)
Total CHOL/HDL Ratio: 2.6 (calc) (ref <5.0)
Triglycerides: 60 mg/dL (ref <150)

## 2022-02-15 LAB — VITAMIN B12: Vitamin B-12: 546 pg/mL (ref 200–1100)

## 2022-04-13 DIAGNOSIS — N3281 Overactive bladder: Secondary | ICD-10-CM | POA: Diagnosis not present

## 2022-04-22 ENCOUNTER — Other Ambulatory Visit: Payer: Self-pay | Admitting: Nurse Practitioner

## 2022-08-15 ENCOUNTER — Encounter: Payer: Self-pay | Admitting: Nurse Practitioner

## 2022-08-15 ENCOUNTER — Ambulatory Visit (INDEPENDENT_AMBULATORY_CARE_PROVIDER_SITE_OTHER): Payer: Medicare Other | Admitting: Nurse Practitioner

## 2022-08-15 VITALS — BP 130/84 | HR 70 | Temp 97.1°F | Ht 61.0 in | Wt 160.0 lb

## 2022-08-15 DIAGNOSIS — E782 Mixed hyperlipidemia: Secondary | ICD-10-CM | POA: Diagnosis not present

## 2022-08-15 DIAGNOSIS — Z6830 Body mass index (BMI) 30.0-30.9, adult: Secondary | ICD-10-CM

## 2022-08-15 DIAGNOSIS — F39 Unspecified mood [affective] disorder: Secondary | ICD-10-CM | POA: Diagnosis not present

## 2022-08-15 DIAGNOSIS — N3281 Overactive bladder: Secondary | ICD-10-CM | POA: Diagnosis not present

## 2022-08-15 DIAGNOSIS — M1710 Unilateral primary osteoarthritis, unspecified knee: Secondary | ICD-10-CM

## 2022-08-15 DIAGNOSIS — H6123 Impacted cerumen, bilateral: Secondary | ICD-10-CM

## 2022-08-15 DIAGNOSIS — E6609 Other obesity due to excess calories: Secondary | ICD-10-CM | POA: Diagnosis not present

## 2022-08-15 NOTE — Patient Instructions (Signed)
Can use tylenol 325 mg 2 tablets every 6 hours as needed for pain.

## 2022-08-15 NOTE — Progress Notes (Signed)
Careteam: Patient Care Team: Sharon Seller, NP as PCP - General (Geriatric Medicine) Pierce Crane, MD (Inactive) (Hematology and Oncology) Margaretmary Dys, MD (Radiation Oncology) Cyndia Bent, MD (Inactive) (General Surgery)  PLACE [facility] SERVICE:  [facility]  Advanced Directive information Does Patient Have a Medical Advance Directive?: Yes, Type of Advance Directive: Healthcare Power of Attorney, Does patient want to make changes to medical advance directive?: No - Patient declined  No Known Allergies  Chief Complaint  Patient presents with   Medical Management of Chronic Issues    6 month follow-up. Discuss need for td/tdap, covid booster, and flu vaccine (not available for documentation in Epic). Here with daughter [name].      HPI: Patient is a 88 y.o. female for routine follow up.   She does not take her calcium supplements  Continues to drink a lot of caffeine- recommended not to due to excessive urination/OAB  Mood has been doing good. Feels well overall.   Memory has been unchanged.   Has not changed diet. Sits a lot because her knee pain   OA of knee, does not take medication.   Review of Systems:  Review of Systems  Constitutional:  Negative for chills, fever and weight loss.  HENT:  Negative for tinnitus.   Respiratory:  Negative for cough, sputum production and shortness of breath.   Cardiovascular:  Negative for chest pain, palpitations and leg swelling.  Gastrointestinal:  Negative for abdominal pain, constipation, diarrhea and heartburn.  Genitourinary:  Negative for dysuria, frequency and urgency.  Musculoskeletal:  Positive for joint pain. Negative for back pain, falls and myalgias.  Skin: Negative.   Neurological:  Negative for dizziness and headaches.  Psychiatric/Behavioral:  Negative for depression and memory loss. The patient does not have insomnia.    Past Medical History:  Diagnosis Date   Cancer ([facility])    rt breast   hx: breast  cancer, right UIQ, invasive, receptor + her 2 - 09/12/2007   Past Surgical History:  Procedure Laterality Date   BREAST LUMPECTOMY     x2 right breast   CHOLECYSTECTOMY     Per [facility] New Patient Packet    Social History:   reports that she quit smoking about 48 years ago. Her smoking use included cigarettes. She started smoking about 58 years ago. She has a 10 pack-year smoking history. She has never used smokeless tobacco. She reports that she does not drink alcohol and does not use drugs.  Family History  Problem Relation Age of Onset   Cancer Mother        breast (57), leukemia (36), colon (55),    Arthritis Mother    Osteoporosis Mother    Cancer Father        prostate   Dementia Father    Cancer Sister        breast (22s), colon (72), currently has alzheimers   Dementia Sister    Cancer Brother 57       colon, prostate, liver   Cancer Paternal Grandmother        pancreatic   Cancer Other 45       breast cancer, BRCA1 VUS and BRCA2 VUS   Cancer Other 50       breast cancer   Other Daughter        BRCA2 VUS   Cancer Cousin 60       breast cancer, BRCA2 varient    Cancer Cousin 50  breast    Medications: Patient's Medications  New Prescriptions   No medications on file  Previous Medications   DONEPEZIL (ARICEPT) 5 MG TABLET    TAKE 1 TABLET(5 MG) BY MOUTH AT BEDTIME   MULTIPLE VITAMINS-MINERALS (CENTRUM ADULT PO)    Take 1 tablet by mouth daily.   VITAMIN B-12 (CYANOCOBALAMIN) 1000 MCG TABLET    Take 1 tablet (1,000 mcg total) by mouth daily.  Modified Medications   No medications on file  Discontinued Medications   CALCIUM CARBONATE (CALCIUM 500 PO)    Take by mouth daily.    Physical Exam:  Vitals:   08/15/22 0849  BP: 130/84  Pulse: 70  Temp: (!) 97.1 F (36.2 C)  TempSrc: Temporal  SpO2: 98%  Weight: 160 lb (72.6 kg)  Height: 5\' 1"  (1.549 m)   Body mass index is 30.23 kg/m. Wt Readings from Last 3 Encounters:  08/15/22 160 lb (72.6 kg)   02/11/22 155 lb 6.4 oz (70.5 kg)  01/04/22 156 lb (70.8 kg)    Physical Exam Constitutional:      General: She is not in acute distress.    Appearance: She is well-developed. She is not diaphoretic.  HENT:     Head: Normocephalic and atraumatic.     Right Ear: There is impacted cerumen.     Left Ear: There is impacted cerumen.     Mouth/Throat:     Pharynx: No oropharyngeal exudate.  Eyes:     Conjunctiva/sclera: Conjunctivae normal.     Pupils: Pupils are equal, round, and reactive to light.  Cardiovascular:     Rate and Rhythm: Normal rate and regular rhythm.     Heart sounds: Normal heart sounds.  Pulmonary:     Effort: Pulmonary effort is normal.     Breath sounds: Normal breath sounds.  Abdominal:     General: Bowel sounds are normal.     Palpations: Abdomen is soft.  Musculoskeletal:     Cervical back: Normal range of motion and neck supple.     Right lower leg: No edema.     Left lower leg: No edema.  Skin:    General: Skin is warm and dry.  Neurological:     Mental Status: She is alert.  Psychiatric:        Mood and Affect: Mood normal.     Labs reviewed: Basic Metabolic Panel: Recent Labs    02/14/22 0915  NA 141  K 4.4  CL 105  CO2 27  GLUCOSE 89  BUN 11  CREATININE 0.77  CALCIUM 9.8   Liver Function Tests: Recent Labs    02/14/22 0915  AST 27  ALT 21  BILITOT 0.6  PROT 6.6   No results for input(s): "LIPASE", "AMYLASE" in the last 8760 hours. No results for input(s): "AMMONIA" in the last 8760 hours. CBC: Recent Labs    02/14/22 0915  WBC 6.5  NEUTROABS 4,667  HGB 13.4  HCT 40.0  MCV 87.7  PLT 278   Lipid Panel: Recent Labs    02/14/22 0915  CHOL 248*  HDL 96  LDLCALC 137*  TRIG 60  CHOLHDL 2.6   TSH: No results for input(s): "TSH" in the last 8760 hours. A1C: No results found for: "HGBA1C"   Assessment/Plan 1. Primary osteoarthritis of knee, unspecified laterality -Can use tylenol 325 mg 2 tablets every 6 hours  as needed for pain.  - COMPLETE METABOLIC PANEL WITH GFR - CBC with Differential/Platelet  2. Mood disorder ([facility])  Stable at this time, continue lifestyle modifications.   3. OAB (overactive bladder) -ongoing, continue lifestyle modifications at this time  4. Class 1 obesity due to excess calories without serious comorbidity with body mass index (BMI) of 30.0 to 30.9 in adult --education provided on healthy weight loss through increase in physical activity and proper nutrition   5. Mixed hyperlipidemia -continue to work on dietary modifications, not currently on medication will follow up lab at this time.  - Lipid panel - COMPLETE METABOLIC PANEL WITH GFR  6. Excessive cerumen in ear canal, bilateral -ear lavage bilaterally, pt tolerated well.    Return in about 6 months (around 02/15/2023) for routine follow up .:   Norvil Martensen K. Biagio [facility] [phone]

## 2022-09-28 ENCOUNTER — Telehealth: Payer: Self-pay | Admitting: Physician Assistant

## 2022-09-28 NOTE — Telephone Encounter (Signed)
FYI Patient's daughter Elita Quick called to schedule an appointment with [place]. [name] advised patient has Dementia.

## 2022-09-29 ENCOUNTER — Ambulatory Visit: Payer: Medicare Other | Admitting: Physician Assistant

## 2022-09-29 ENCOUNTER — Encounter: Payer: Self-pay | Admitting: Physician Assistant

## 2022-09-29 DIAGNOSIS — M1711 Unilateral primary osteoarthritis, right knee: Secondary | ICD-10-CM | POA: Diagnosis not present

## 2022-09-29 MED ORDER — LIDOCAINE HCL 1 % IJ SOLN
6.0000 mL | INTRAMUSCULAR | Status: AC | PRN
Start: 2022-09-29 — End: 2022-09-29
  Administered 2022-09-29: 6 mL

## 2022-09-29 MED ORDER — METHYLPREDNISOLONE ACETATE 40 MG/ML IJ SUSP
40.0000 mg | INTRAMUSCULAR | Status: AC | PRN
Start: 2022-09-29 — End: 2022-09-29
  Administered 2022-09-29: 40 mg via INTRA_ARTICULAR

## 2022-09-29 NOTE — Progress Notes (Signed)
Office Visit Note   Patient: [name]           Date of Birth: 29-May-1934           MRN: [id number] Visit Date: 09/29/2022              Requested by: Stephanie Seller, NP [address] [place] [place],  [place] [address] PCP: Stephanie Seller, NP  Chief Complaint  Patient presents with  . Right Knee - Pain      HPI: [name] is a pleasant 88 year old woman who is accompanied by her daughter today.  She has a history of osteoarthritis of her right knee.  She has had both viscosupplementation and steroid injections before.  Her daughter did not think that her mom got really any relief from the viscosupplementation but did have good relief from the steroid and has not had one in quite a while.  Her daughter also notes that she has quite a bit of swelling in the knee.  No injury no fever or chills   Assessment & Plan: Visit Diagnoses: Osteoarthritis right knee  Plan: Will go forward with an aspiration and injection of steroid today.  May follow-up with me as needed  Follow-Up Instructions: Return if symptoms worsen or fail to improve.   Ortho Exam  Patient is alert, oriented, no adenopathy, well-dressed, normal affect, normal respiratory effort. Examination of her right knee she is neurovascularly intact she has a moderate effusion no erythema or cellulitis compartments are soft and nontender she is neurovascularly intact  Imaging: No results found. No images are attached to the encounter.  Labs: No results found for: "HGBA1C", "ESRSEDRATE", "CRP", "LABURIC", "REPTSTATUS", "GRAMSTAIN", "CULT", "LABORGA"   Lab Results  Component Value Date   ALBUMIN 3.8 02/29/2016   ALBUMIN 3.7 01/15/2015   ALBUMIN 3.5 01/16/2014    No results found for: "MG" Lab Results  Component Value Date   VD25OH 32 10/04/2011   VD25OH 44 04/01/2011   VD25OH 45 09/27/2010    No results found for: "PREALBUMIN"    Latest Ref Rng & Units 08/15/2022    9:29 AM 02/14/2022    9:15 AM  06/08/2020    9:20 AM  CBC EXTENDED  WBC 3.8 - 10.8 Thousand/uL 6.7  6.5  5.3   RBC 3.80 - 5.10 Million/uL 4.48  4.56  4.70   Hemoglobin 11.7 - 15.5 g/dL 19.1  47.8  29.5   HCT 35.0 - 45.0 % 39.5  40.0  41.7   Platelets 140 - 400 Thousand/uL 282  278  274   NEUT# 1,500 - 7,800 cells/uL 4,971  4,667  4,086   Lymph# 850 - 3,900 cells/uL 998  1,112  779      There is no height or weight on file to calculate BMI.  Orders:  No orders of the defined types were placed in this encounter.  No orders of the defined types were placed in this encounter.    Procedures: Large Joint Inj: R knee on 09/29/2022 10:57 AM Indications: pain and diagnostic evaluation Details: 25 G 1.5 in needle, superolateral approach  Arthrogram: No  Medications: 40 mg methylPREDNISolone acetate 40 MG/ML; 6 mL lidocaine 1 % Aspirate: 85 mL yellow Outcome: tolerated well, no immediate complications  After obtaining verbal consent from the patient and her daughter the superior lateral part of her knee was prepped with alcohol followed by Betadine.  3 cc of lidocaine plain was injected.  When adequate analgesia was achieved area was reprepped.  18-gauge syringe was inserted.  85 cc of clear joint fluid was aspirated.  40 cc of Depo-Medrol with 3 cc of lidocaine was injected.  Band-Aid and Ace wrap were applied patient ambulated from the clinic Procedure, treatment alternatives, risks and benefits explained, specific risks discussed. Consent was given by the patient. Immediately prior to procedure a time out was called to verify the correct patient, procedure, equipment, support staff and site/side marked as required. Patient was prepped and draped in the usual sterile fashion.    Clinical Data: No additional findings.  ROS:  All other systems negative, except as noted in the HPI. Review of Systems  Objective: Vital Signs: There were no vitals taken for this visit.  Specialty Comments:  No specialty comments  available.  PMFS History: Patient Active Problem List   Diagnosis Date Noted  . Unilateral primary osteoarthritis, right knee 04/13/2021  . Mood disorder ([facility]) 02/01/2021  . Memory loss 12/05/2018  . Mixed hyperlipidemia 12/05/2018  . OAB (overactive bladder) 12/05/2018  . B12 deficiency 12/05/2018  . Pilar cyst 01/10/2012   Past Medical History:  Diagnosis Date  . Cancer ([facility])    rt breast  . hx: breast cancer, right UIQ, invasive, receptor + her 2 - 09/12/2007    Family History  Problem Relation Age of Onset  . Cancer Mother        breast (76), leukemia (64), colon (80),   . Arthritis Mother   . Osteoporosis Mother   . Cancer Father        prostate  . Dementia Father   . Cancer Sister        breast (40s), colon (72), currently has alzheimers  . Dementia Sister   . Cancer Brother 57       colon, prostate, liver  . Cancer Paternal Grandmother        pancreatic  . Cancer Other 45       breast cancer, BRCA1 VUS and BRCA2 VUS  . Cancer Other 50       breast cancer  . Other Daughter        BRCA2 VUS  . Cancer Cousin 60       breast cancer, BRCA2 varient   . Cancer Cousin 50       breast    Past Surgical History:  Procedure Laterality Date  . BREAST LUMPECTOMY     x2 right breast  . CHOLECYSTECTOMY     Per [facility] New Patient Packet    Social History   Occupational History  . Not on file  Tobacco Use  . Smoking status: Former    Current packs/day: 0.00    Average packs/day: 1 pack/day for 10.0 years (10.0 ttl pk-yrs)    Types: Cigarettes    Start date: 01/04/1964    Quit date: 01/03/1974    Years since quitting: 48.7  . Smokeless tobacco: Never  . Tobacco comments:    Quit at age 56   Vaping Use  . Vaping status: Never Used  Substance and Sexual Activity  . Alcohol use: No  . Drug use: No  . Sexual activity: Not Currently

## 2022-11-09 ENCOUNTER — Other Ambulatory Visit: Payer: Self-pay | Admitting: Nurse Practitioner

## 2023-01-06 ENCOUNTER — Encounter: Payer: Self-pay | Admitting: Nurse Practitioner

## 2023-01-09 ENCOUNTER — Telehealth: Payer: Self-pay | Admitting: Nurse Practitioner

## 2023-01-09 ENCOUNTER — Ambulatory Visit: Payer: Medicare Other | Admitting: Nurse Practitioner

## 2023-01-09 DIAGNOSIS — Z23 Encounter for immunization: Secondary | ICD-10-CM

## 2023-01-09 NOTE — Telephone Encounter (Signed)
 I called the patient who was already on the way to her appointment but was disappointed that the office had a delayed opening she decided to cancel the AWV for this year due to the inconvenience. Just wanted the provider to know she was not upset just a bit disappointed since she was already on the way to the office.

## 2023-01-09 NOTE — Progress Notes (Signed)
 This encounter was created in error - please disregard.

## 2023-01-09 NOTE — Telephone Encounter (Signed)
 Can we call the reschedule (even if its next month or in a few months). Thank you

## 2023-02-10 NOTE — Patient Instructions (Signed)
 1.) Schedule and annual wellness visit (video or in person) with in the next 30 days  2.) Visit your local pharmacy to get your covid and td/tdap booster

## 2023-02-13 ENCOUNTER — Ambulatory Visit (INDEPENDENT_AMBULATORY_CARE_PROVIDER_SITE_OTHER): Payer: Medicare Other | Admitting: Nurse Practitioner

## 2023-02-13 ENCOUNTER — Encounter: Payer: Self-pay | Admitting: Nurse Practitioner

## 2023-02-13 VITALS — BP 126/84 | HR 65 | Temp 97.1°F | Ht 61.0 in

## 2023-02-13 DIAGNOSIS — F39 Unspecified mood [affective] disorder: Secondary | ICD-10-CM

## 2023-02-13 DIAGNOSIS — R413 Other amnesia: Secondary | ICD-10-CM

## 2023-02-13 DIAGNOSIS — Z6830 Body mass index (BMI) 30.0-30.9, adult: Secondary | ICD-10-CM

## 2023-02-13 DIAGNOSIS — N3281 Overactive bladder: Secondary | ICD-10-CM

## 2023-02-13 DIAGNOSIS — M8580 Other specified disorders of bone density and structure, unspecified site: Secondary | ICD-10-CM | POA: Diagnosis not present

## 2023-02-13 DIAGNOSIS — E2839 Other primary ovarian failure: Secondary | ICD-10-CM

## 2023-02-13 DIAGNOSIS — M1710 Unilateral primary osteoarthritis, unspecified knee: Secondary | ICD-10-CM

## 2023-02-13 DIAGNOSIS — E66811 Obesity, class 1: Secondary | ICD-10-CM

## 2023-02-13 DIAGNOSIS — E782 Mixed hyperlipidemia: Secondary | ICD-10-CM

## 2023-02-13 DIAGNOSIS — Z23 Encounter for immunization: Secondary | ICD-10-CM | POA: Diagnosis not present

## 2023-02-13 DIAGNOSIS — E6609 Other obesity due to excess calories: Secondary | ICD-10-CM

## 2023-02-13 NOTE — Progress Notes (Signed)
 Careteam: Patient Care Team: Verma Gobble, NP as PCP - General (Geriatric Medicine) Bartley Borrow, MD (Inactive) (Hematology and Oncology) Kenith Payer, MD (Radiation Oncology) Burma Carrel, MD (Inactive) (General Surgery)  PLACE OF SERVICE:  [facility]  Advanced Directive information    No Known Allergies  Chief Complaint  Patient presents with   Medical Management of Chronic Issues    6 month follow-up. Discussed need for AWV, covid booster and td/tdap      HPI: Patient is a 89 y.o. female who presents today for her 6 month chronic visit follow up. She is accompanied by her daughter today. Patient is alert and oriented and answers questions appropriately. She has not changed her diet or daily caffeine intake since her last visit. Her right knee continues to be swollen and painful. She follows Orthopedics for this issue. She does not get much physical activity in.   Patient and daughter both state that pt does not want to take any additional medication regardless of cholesterol level. \  She will update he TDAP at her pharmacy.   She would like to get the flu vaccine in office today.  Pt denies any chest pain, headaches, vision changes, bowel and bladder changes, level of activity changes, shortness of breath, and falls.   Review of Systems:  Review of Systems  Constitutional: Negative.   HENT: Negative.    Eyes: Negative.   Respiratory: Negative.    Cardiovascular: Negative.   Gastrointestinal: Negative.   Genitourinary:  Positive for frequency and urgency.  Musculoskeletal:  Positive for joint pain.  Skin: Negative.   Neurological: Negative.   Endo/Heme/Allergies: Negative.   Psychiatric/Behavioral: Negative.     Past Medical History:  Diagnosis Date   Cancer (HCC)    rt breast   hx: breast cancer, right UIQ, invasive, receptor + her 2 - 09/12/2007   Past Surgical History:  Procedure Laterality Date   BREAST LUMPECTOMY     x2 right breast    CHOLECYSTECTOMY     Per [facility] New Patient Packet    Social History:   reports that she quit smoking about 49 years ago. Her smoking use included cigarettes. She started smoking about 59 years ago. She has a 10 pack-year smoking history. She has never used smokeless tobacco. She reports that she does not drink alcohol and does not use drugs.  Family History  Problem Relation Age of Onset   Cancer Mother        breast (35), leukemia (46), colon (6),    Arthritis Mother    Osteoporosis Mother    Cancer Father        prostate   Dementia Father    Cancer Sister        breast (65s), colon (72), currently has alzheimers   Dementia Sister    Cancer Brother 86       colon, prostate, liver   Cancer Paternal Grandmother        pancreatic   Cancer Other 45       breast cancer, BRCA1 VUS and BRCA2 VUS   Cancer Other 50       breast cancer   Other Daughter        BRCA2 VUS   Cancer Cousin 60       breast cancer, BRCA2 varient    Cancer Cousin 50       breast    Medications: Patient's Medications  New Prescriptions   No medications on file  Previous Medications  DONEPEZIL  (ARICEPT ) 5 MG TABLET    TAKE 1 TABLET(5 MG) BY MOUTH AT BEDTIME   MULTIPLE VITAMINS-MINERALS (CENTRUM ADULT PO)    Take 1 tablet by mouth daily.   VITAMIN B-12 (CYANOCOBALAMIN ) 1000 MCG TABLET    Take 1 tablet (1,000 mcg total) by mouth daily.  Modified Medications   No medications on file  Discontinued Medications   No medications on file    Physical Exam:  Vitals:   02/10/23 1554  BP: 126/84  Pulse: 65  Temp: (!) 97.1 F (36.2 C)  TempSrc: Temporal  SpO2: 97%  Height: 5\' 1"  (1.549 m)   Body mass index is 30.23 kg/m. Wt Readings from Last 3 Encounters:  08/15/22 160 lb (72.6 kg)  02/11/22 155 lb 6.4 oz (70.5 kg)  01/04/22 156 lb (70.8 kg)    Physical Exam Vitals reviewed.  Constitutional:      Appearance: Normal appearance.  HENT:     Head: Normocephalic and atraumatic.     Right Ear:  External ear normal.     Left Ear: External ear normal.     Nose: Nose normal.     Mouth/Throat:     Mouth: Mucous membranes are moist.     Pharynx: Oropharynx is clear.  Eyes:     Conjunctiva/sclera: Conjunctivae normal.  Cardiovascular:     Rate and Rhythm: Normal rate and regular rhythm.     Pulses: Normal pulses.     Heart sounds: Normal heart sounds.  Pulmonary:     Effort: Pulmonary effort is normal.     Breath sounds: Normal breath sounds.  Abdominal:     General: Bowel sounds are normal.     Palpations: Abdomen is soft.  Musculoskeletal:        General: Swelling and tenderness present.     Cervical back: Normal range of motion.     Right knee: Swelling present.     Comments: Right knee tender, swollen > left knee. This is chronic for patient.  Skin:    General: Skin is warm and dry.  Neurological:     Mental Status: She is alert. Mental status is at baseline.  Psychiatric:        Mood and Affect: Mood normal.        Behavior: Behavior normal.     Labs reviewed: Basic Metabolic Panel: Recent Labs    02/14/22 0915 08/15/22 0929  NA 141 142  K 4.4 4.3  CL 105 106  CO2 27 29  GLUCOSE 89 69  BUN 11 14  CREATININE 0.77 0.87  CALCIUM 9.8 10.1   Liver Function Tests: Recent Labs    02/14/22 0915 08/15/22 0929  AST 27 24  ALT 21 21  BILITOT 0.6 0.4  PROT 6.6 6.5   No results for input(s): "LIPASE", "AMYLASE" in the last 8760 hours. No results for input(s): "AMMONIA" in the last 8760 hours. CBC: Recent Labs    02/14/22 0915 08/15/22 0929  WBC 6.5 6.7  NEUTROABS 4,667 4,971  HGB 13.4 13.0  HCT 40.0 39.5  MCV 87.7 88.2  PLT 278 282   Lipid Panel: Recent Labs    02/14/22 0915 08/15/22 0929  CHOL 248* 245*  HDL 96 91  LDLCALC 137* 138*  TRIG 60 65  CHOLHDL 2.6 2.7   TSH: No results for input(s): "TSH" in the last 8760 hours. A1C: No results found for: "HGBA1C"   Assessment/Plan 1. Primary osteoarthritis of knee, unspecified  laterality (Primary) - Chronic issue, stable follows Orthopedics  for right knee pain and swelling. - Discussed elevating legs when sitting Can use tylenol 1000 mg by mouth every 8 hours as needed for pain  2. OAB (overactive bladder) - Chronic issue, continues to drink 3-4 beverages a day with caffeine  - Denies any acute changes, states she is okay with frequent urination - Denies any incontinence   3. Mixed hyperlipidemia - Pt continues diet high in unhealthy fats (snacks) - Last cholesterol check in August 2024 with elevated LDL  - Patient and daughter both decline cholesterol medication  4. Class 1 obesity due to excess calories without serious comorbidity with body mass index (BMI) of 30.0 to 30.9 in adult - Discussed importance of daily physical activity - Discussed low fat, heart healthy diet  5. Mood disorder (HCC) - Stable, not currently on medication at this time   6. Need for influenza vaccination - Flu Vaccine Trivalent High Dose (Fluad) ordered and administered in clinic today  7. Osteopenia, unspecified location - Diet high in calcium and vitamin D  - Declines additional vitamins - Denies any falls - Bone density exam ordered  8. Estrogen deficiency - DG Bone Density ordered  9. Memory loss -Stable, no acute changes in cognitive or functional status, continue supportive care.  -continues aricept  daily   Follow up in March for Annual Wellness Visit.  Darliss Ek, RN DNP-AGPCNP Student -I personally was present during the history, physical exam and medical decision-making activities of this service and have verified that the service and findings are accurately documented in the student's note Chiann Goffredo K. Denney Fisherman [facility] [phone]

## 2023-03-10 ENCOUNTER — Ambulatory Visit: Payer: Medicare Other | Admitting: Nurse Practitioner

## 2023-03-10 ENCOUNTER — Encounter: Payer: Self-pay | Admitting: Nurse Practitioner

## 2023-03-10 VITALS — BP 120/74 | HR 75 | Temp 98.5°F | Resp 17 | Ht 61.0 in | Wt 161.0 lb

## 2023-03-10 DIAGNOSIS — Z0000 Encounter for general adult medical examination without abnormal findings: Secondary | ICD-10-CM

## 2023-03-10 NOTE — Progress Notes (Signed)
 Subjective:   [name] is a 89 y.o. female who presents for Medicare Annual (Subsequent) preventive examination.  Visit Complete: In person at [facility]  Cardiac Risk Factors include: advanced age (>48men, >92 women);sedentary lifestyle     Objective:    Today's Vitals   03/10/23 0846  BP: 120/74  Pulse: 75  Resp: 17  Temp: 98.5 F (36.9 C)  TempSrc: Temporal  SpO2: 99%  Weight: 161 lb (73 kg)  Height: 5\' 1"  (1.549 m)   Body mass index is 30.42 kg/m.     08/15/2022    8:56 AM 02/14/2022    9:09 AM 01/05/2022   10:16 AM 02/01/2021    8:55 AM 12/22/2020   10:39 AM 12/09/2020   11:17 AM 06/12/2020   11:15 AM  Advanced Directives  Does Patient Have a Medical Advance Directive? Yes Yes Yes Yes Yes Yes Yes  Type of Estate agent of [employer] Power of [place];Living will Healthcare Power of [place];Living will Healthcare Power of [place];Living will Healthcare Power of [employer] Power of [employer] Power of [place];Living will  Does patient want to make changes to medical advance directive? No - Patient declined No - Patient declined No - Patient declined No - Patient declined No - Patient declined No - Patient declined   Copy of Healthcare Power of Attorney in Chart? Yes - validated most recent copy scanned in chart (See row information) Yes - validated most recent copy scanned in chart (See row information) Yes - validated most recent copy scanned in chart (See row information) Yes - validated most recent copy scanned in chart (See row information) Yes - validated most recent copy scanned in chart (See row information) Yes - validated most recent copy scanned in chart (See row information) Yes - validated most recent copy scanned in chart (See row information)  Would patient like information on creating a medical advance directive?       No - Patient declined    Current Medications (verified) Outpatient Encounter Medications  as of 03/10/2023  Medication Sig   donepezil (ARICEPT) 5 MG tablet TAKE 1 TABLET(5 MG) BY MOUTH AT BEDTIME   Multiple Vitamins-Minerals (CENTRUM ADULT PO) Take 1 tablet by mouth daily.   vitamin B-12 (CYANOCOBALAMIN) 1000 MCG tablet Take 1 tablet (1,000 mcg total) by mouth daily.   No facility-administered encounter medications on file as of 03/10/2023.    Allergies (verified) Patient has no known allergies.   History: Past Medical History:  Diagnosis Date   Cancer (HCC)    rt breast   hx: breast cancer, right UIQ, invasive, receptor + her 2 - 09/12/2007   Past Surgical History:  Procedure Laterality Date   BREAST LUMPECTOMY     x2 right breast   CHOLECYSTECTOMY     Per [facility] New Patient Packet    Family History  Problem Relation Age of Onset   Cancer Mother        breast (79), leukemia (43), colon (19),    Arthritis Mother    Osteoporosis Mother    Cancer Father        prostate   Dementia Father    Cancer Sister        breast (48s), colon (72), currently has alzheimers   Dementia Sister    Cancer Brother 72       colon, prostate, liver   Cancer Paternal Grandmother        pancreatic   Cancer Other 70  breast cancer, BRCA1 VUS and BRCA2 VUS   Cancer Other 50       breast cancer   Other Daughter        BRCA2 VUS   Cancer Cousin 60       breast cancer, BRCA2 varient    Cancer Cousin 50       breast   Social History   Socioeconomic History   Marital status: Single    Spouse name: Not on file   Number of children: Not on file   Years of education: Not on file   Highest education level: Not on file  Occupational History   Not on file  Tobacco Use   Smoking status: Former    Current packs/day: 0.00    Average packs/day: 1 pack/day for 10.0 years (10.0 ttl pk-yrs)    Types: Cigarettes    Start date: 01/04/1964    Quit date: 01/03/1974    Years since quitting: 49.2   Smokeless tobacco: Never   Tobacco comments:    Quit at age 78   Vaping Use   Vaping  status: Never Used  Substance and Sexual Activity   Alcohol use: No   Drug use: No   Sexual activity: Not Currently  Other Topics Concern   Not on file  Social History Narrative   Diet: N/A      Caffeine: Soda, coffee, and tea       Married, if yes what year: Divorced       Do you live in a house, apartment, assisted living, condo, trailer, ect: House, 2 stories, 2 persons      Pets: 1 Artist 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 No   Do you have difficulty preparing food or eating? No   Do you have difficulty managing your medications? No   Do you have difficulty managing your finances? No   Do you have difficulty affording your medications? No   Social Drivers of Corporate investment banker Strain: Not on file  Food Insecurity: Not on file  Transportation Needs: Not on file  Physical Activity: Not on file  Stress: Not on file  Social Connections: Not on file    Tobacco Counseling Counseling given: Not Answered Tobacco comments: Quit at age 23    Clinical Intake:  Pre-visit preparation completed: Yes  Pain : No/denies pain     BMI - recorded: 30.42 Nutritional Status: BMI > 30  Obese Nutritional Risks: None Diabetes: No  How often do you need to have someone help you when you read instructions, pamphlets, or other written materials from your doctor or pharmacy?: 4 - Often What is the last grade level you completed in school?: some college  Interpreter Needed?: No      Activities of Daily Living    03/10/2023    9:16 AM 03/10/2023    9:03 AM  In your present state of health, do you have any difficulty performing the following activities:  Hearing?  0  Vision?  0  Difficulty concentrating or making decisions?  0  Walking or climbing stairs?  0  Dressing or bathing?  0   Doing errands, shopping?  0  Preparing Food  and eating ? N   Using the Toilet? N   In the past six months, have you accidently leaked urine? N   Do you have problems with loss of bowel control? N   Managing your Medications? N   Managing your Finances? N   Housekeeping or managing your Housekeeping? N     Patient Care Team: Sharon Seller, NP as PCP - General (Geriatric Medicine) Pierce Crane, MD (Inactive) (Hematology and Oncology) Margaretmary Dys, MD (Radiation Oncology) Cyndia Bent, MD (Inactive) (General Surgery)  Indicate any recent Medical Services you may have received from other than [facility] providers in the past year (date may be approximate).     Assessment:   This is a routine wellness examination for Lanyia.  Hearing/Vision screen Vision Screening   Right eye Left eye Both eyes  Without correction 20/40 20/40 20/50   With correction        Goals Addressed   None    Depression Screen    03/10/2023    9:00 AM 02/13/2023    9:28 AM 01/05/2022   10:14 AM 12/22/2020   10:37 AM 06/12/2020   11:14 AM 12/17/2019    1:08 PM 06/05/2019    3:38 PM  PHQ 2/9 Scores  PHQ - 2 Score 0 0 0 0 0 0 0    Fall Risk    03/10/2023    9:00 AM 08/15/2022    8:56 AM 02/14/2022    9:08 AM 01/05/2022   10:13 AM 12/22/2020   10:38 AM  Fall Risk   Falls in the past year? 1 0 0 1 1  Number falls in past yr: 0 0 0 1 1  Injury with Fall? 0 0 0 0 0  Risk for fall due to : No Fall Risks No Fall Risks No Fall Risks History of fall(s) History of fall(s)  Follow up Falls evaluation completed Falls evaluation completed Falls evaluation completed Falls evaluation completed Falls evaluation completed    MEDICARE RISK AT HOME:    TIMED UP AND GO:  Was the test performed?  No    Cognitive Function:    01/05/2022   10:18 AM 12/13/2019    2:37 PM 06/26/2018    9:36 AM  MMSE - Mini Mental State Exam  Orientation to time 1 2 4   Orientation to Place 3 3 5   Registration 3 3 3    Attention/ Calculation 5 5 5   Recall 1 1 1   Language- name 2 objects 2 2 2   Language- repeat 1 1 1   Language- follow 3 step command 3 3 3   Language- read & follow direction 1 1 1   Write a sentence 1 1 1   Copy design 1 0 0  Total score 22 22 26         03/10/2023    9:01 AM 12/22/2020   10:40 AM 12/14/2018    2:24 PM  6CIT Screen  What Year? 0 points 0 points 0 points  What month? 3 points 0 points 0 points  What time? 3 points 0 points 3 points  Count back from 20 0 points 0 points 0 points  Months in reverse 0 points 0 points 0 points  Repeat phrase 10 points 10 points 0 points  Total Score 16 points 10 points 3 points    Immunizations Immunization History  Administered Date(s) Administered   Fluad Quad(high Dose 65+) 12/13/2019, 12/09/2020, 01/05/2022   Fluad Trivalent(High Dose 65+) 02/13/2023   Influenza, High Dose Seasonal PF 12/05/2014, 10/18/2017  Influenza, Seasonal, Injecte, Preservative Fre 11/09/2015   Influenza-Unspecified 10/04/2018   PFIZER(Purple Top)SARS-COV-2 Vaccination 01/24/2019, 02/14/2019, 12/21/2019   Pneumococcal Conjugate-13 10/27/2017   Pneumococcal Polysaccharide-23 12/05/2014   Zoster Recombinant(Shingrix) 12/14/2017, 09/26/2018   Zoster, Live 01/16/2014    TDAP status: Due, Education has been provided regarding the importance of this vaccine. Advised may receive this vaccine at local pharmacy or Health Dept. Aware to provide a copy of the vaccination record if obtained from local pharmacy or Health Dept. Verbalized acceptance and understanding.  Flu Vaccine status: Up to date  Pneumococcal vaccine status: Up to date  Covid-19 vaccine status: Information provided on how to obtain vaccines.   Qualifies for Shingles Vaccine? Yes   Zostavax completed No   Shingrix Completed?: Yes  Screening Tests Health Maintenance  Topic Date Due   DTaP/Tdap/Td (1 - Tdap) 01/04/2024 (Originally 11/20/1953)   COVID-19 Vaccine (4 - 2024-25 season)  01/04/2024 (Originally 09/04/2022)   Medicare Annual Wellness (AWV)  03/09/2024   Pneumonia Vaccine 69+ Years old  Completed   INFLUENZA VACCINE  Completed   DEXA SCAN  Completed   Zoster Vaccines- Shingrix  Completed   HPV VACCINES  Aged Out    Health Maintenance  There are no preventive care reminders to display for this patient.   Colorectal cancer screening: No longer required.   Mammogram status: No longer required due to age.   Bone density completed outside  last year- do not have records  Lung Cancer Screening: (Low Dose CT Chest recommended if Age 22-80 years, 20 pack-year currently smoking OR have quit w/in 15years.) does not qualify.   Lung Cancer Screening Referral: na  Additional Screening:  Hepatitis C Screening: does not qualify  Vision Screening: Recommended annual ophthalmology exams for early detection of glaucoma and other disorders of the eye. Is the patient up to date with their annual eye exam?  No  Who is the provider or what is the name of the office in which the patient attends annual eye exams? [facility] If pt is not established with a provider, would they like to be referred to a provider to establish care? No .   Dental Screening: Recommended annual dental exams for proper oral hygiene   Community Resource Referral / Chronic Care Management: CRR required this visit?  No   CCM required this visit?  No     Plan:     I have personally reviewed and noted the following in the patient's chart:   Medical and social history Use of alcohol, tobacco or illicit drugs  Current medications and supplements including opioid prescriptions. Patient is not currently taking opioid prescriptions. Functional ability and status Nutritional status Physical activity Advanced directives List of other physicians Hospitalizations, surgeries, and ER visits in previous 12 months Vitals Screenings to include cognitive, depression, and falls Referrals and  appointments  In addition, I have reviewed and discussed with patient certain preventive protocols, quality metrics, and best practice recommendations. A written personalized care plan for preventive services as well as general preventive health recommendations were provided to patient.     Sharon Seller, NP   03/10/2023

## 2023-03-10 NOTE — Patient Instructions (Addendum)
  [name] , Thank you for taking time to come for your Medicare Wellness Visit. I appreciate your ongoing commitment to your health goals. Please review the following plan we discussed and let me know if I can assist you in the future.   Please reach out to get records of last bone density and have them send [place] her last scan  To get TDAP vaccine at your local pharmacy  COVID booster are at your local pharmacy     This is a list of the screening recommended for you and due dates:  Health Maintenance  Topic Date Due   DTaP/Tdap/Td vaccine (1 - Tdap) 01/04/2024*   COVID-19 Vaccine (4 - 2024-25 season) 01/04/2024*   Medicare Annual Wellness Visit  03/09/2024   Pneumonia Vaccine  Completed   Flu Shot  Completed   DEXA scan (bone density measurement)  Completed   Zoster (Shingles) Vaccine  Completed   HPV Vaccine  Aged Out  *Topic was postponed. The date shown is not the original due date.

## 2023-04-13 DIAGNOSIS — H6123 Impacted cerumen, bilateral: Secondary | ICD-10-CM | POA: Diagnosis not present

## 2023-05-03 ENCOUNTER — Other Ambulatory Visit: Payer: Self-pay | Admitting: Nurse Practitioner

## 2023-07-19 DIAGNOSIS — H43393 Other vitreous opacities, bilateral: Secondary | ICD-10-CM | POA: Diagnosis not present

## 2023-08-24 DIAGNOSIS — H2513 Age-related nuclear cataract, bilateral: Secondary | ICD-10-CM | POA: Diagnosis not present

## 2023-09-15 ENCOUNTER — Encounter: Payer: Self-pay | Admitting: Nurse Practitioner

## 2023-09-15 ENCOUNTER — Ambulatory Visit (INDEPENDENT_AMBULATORY_CARE_PROVIDER_SITE_OTHER): Payer: Medicare Other | Admitting: Nurse Practitioner

## 2023-09-15 VITALS — BP 126/78 | HR 65 | Temp 97.3°F | Resp 16 | Ht 61.0 in | Wt 159.6 lb

## 2023-09-15 DIAGNOSIS — M1711 Unilateral primary osteoarthritis, right knee: Secondary | ICD-10-CM

## 2023-09-15 DIAGNOSIS — M8580 Other specified disorders of bone density and structure, unspecified site: Secondary | ICD-10-CM

## 2023-09-15 DIAGNOSIS — E782 Mixed hyperlipidemia: Secondary | ICD-10-CM

## 2023-09-15 DIAGNOSIS — Z23 Encounter for immunization: Secondary | ICD-10-CM

## 2023-09-15 DIAGNOSIS — M1710 Unilateral primary osteoarthritis, unspecified knee: Secondary | ICD-10-CM | POA: Diagnosis not present

## 2023-09-15 DIAGNOSIS — E538 Deficiency of other specified B group vitamins: Secondary | ICD-10-CM

## 2023-09-15 DIAGNOSIS — R4189 Other symptoms and signs involving cognitive functions and awareness: Secondary | ICD-10-CM

## 2023-09-15 DIAGNOSIS — F39 Unspecified mood [affective] disorder: Secondary | ICD-10-CM

## 2023-09-15 DIAGNOSIS — N3281 Overactive bladder: Secondary | ICD-10-CM | POA: Diagnosis not present

## 2023-09-15 MED ORDER — MEMANTINE HCL ER 7 MG PO CP24: ORAL_CAPSULE | ORAL | 0 refills | Status: DC

## 2023-09-15 MED ORDER — SPIKEVAX 50 MCG/0.5ML IM SUSP: 0.5000 mL | INTRAMUSCULAR | 1 refills | Status: AC

## 2023-09-15 MED ORDER — MEMANTINE HCL ER 28 MG PO CP24: 28.0000 mg | ORAL_CAPSULE | Freq: Every day | ORAL | 3 refills | Status: DC

## 2023-09-15 NOTE — Patient Instructions (Signed)
 Recommended to take calcium 600 mg twice daily with Vitamin D  2000 units daily and weight bearing activity 30 mins/5 days a week  Namenda  titration pack given to help preserve memory 7 mg tablet given 1 tablet for 1 week then increase  2 tablets for 1 week then increase  3 tablets for 1 week then increase 4 tablets until you need a refill  THEN you will start namenda  28 mg daily

## 2023-09-15 NOTE — Progress Notes (Signed)
 Careteam: Patient Care Team: Caro Harlene POUR, NP as PCP - General (Geriatric Medicine) Melodye Coy, MD (Inactive) (Hematology and Oncology) Patrcia Cough, MD (Radiation Oncology) Merrilyn Handler, MD (Inactive) (General Surgery)  PLACE OF SERVICE:  [facility]  Advanced Directive information Does Patient Have a Medical Advance Directive?: Yes, Type of Advance Directive: Healthcare Power of [place];Living will, Does patient want to make changes to medical advance directive?: No - Patient declined  No Known Allergies  Chief Complaint  Patient presents with   Medical Management of Chronic Issues    Routine visit. Discuss the need for Influenza vaccine.    HPI:  Discussed the use of AI scribe software for clinical note transcription with the patient, who gave verbal consent to proceed.  History of Present Illness [name] is an 89 year old female with memory loss and B12 deficiency who presents for a routine follow-up. She is accompanied by her daughter.  She feels well with no new concerns since her last visit. She has a history of memory loss and is currently taking Aricept  5 mg daily. A previous attempt to increase the dose resulted in irritability, so the dose reduced to 5 mg and continued.   She has a history of B12 deficiency and is taking B12 supplements. Her blood work is due, and she has not had labs done recently.  She has a history of overactive bladder but found the previously prescribed medication ineffective.  She has a history of high cholesterol but is not currently on any medication for it. She is not interested in changing her eating habits or taking medication unless necessary.  She has a history of osteopenia and was previously on vitamin D  and calcium  supplements, but she was not happy taking the calcium  due to the size of the pills.  No abnormal aches or pains, chest pain, palpitations, or respiratory issues. No issues with bowel  movements, blood in stools or urine, constipation, or diarrhea. No issues with mood, anxiety, or depression.    Review of Systems:  Review of Systems  Constitutional:  Negative for chills, fever and weight loss.  HENT:  Negative for tinnitus.   Respiratory:  Negative for cough, sputum production and shortness of breath.   Cardiovascular:  Negative for chest pain, palpitations and leg swelling.  Gastrointestinal:  Negative for abdominal pain, constipation, diarrhea and heartburn.  Genitourinary:  Negative for dysuria, frequency and urgency.  Musculoskeletal:  Negative for back pain, falls, joint pain and myalgias.  Skin: Negative.   Neurological:  Negative for dizziness and headaches.  Psychiatric/Behavioral:  Negative for depression and memory loss. The patient does not have insomnia.     Past Medical History:  Diagnosis Date   Cancer (HCC)    rt breast   hx: breast cancer, right UIQ, invasive, receptor + her 2 - 09/12/2007   Past Surgical History:  Procedure Laterality Date   BREAST LUMPECTOMY     x2 right breast   CHOLECYSTECTOMY     Per [facility] New Patient Packet    Social History:   reports that she quit smoking about 49 years ago. Her smoking use included cigarettes. She started smoking about 59 years ago. She has a 10 pack-year smoking history. She has never used smokeless tobacco. She reports that she does not drink alcohol and does not use drugs.  Family History  Problem Relation Age of Onset   Cancer Mother        breast (76), leukemia (64), colon (80),  Arthritis Mother    Osteoporosis Mother    Cancer Father        prostate   Dementia Father    Cancer Sister        breast (31s), colon (72), currently has alzheimers   Dementia Sister    Cancer Brother 27       colon, prostate, liver   Cancer Paternal Grandmother        pancreatic   Cancer Other 45       breast cancer, BRCA1 VUS and BRCA2 VUS   Cancer Other 50       breast cancer   Other Daughter         BRCA2 VUS   Cancer Cousin 60       breast cancer, BRCA2 varient    Cancer Cousin 50       breast    Medications: Patient's Medications  New Prescriptions   No medications on file  Previous Medications   DONEPEZIL  (ARICEPT ) 5 MG TABLET    TAKE 1 TABLET(5 MG) BY MOUTH AT BEDTIME   MULTIPLE VITAMINS-MINERALS (CENTRUM ADULT PO)    Take 1 tablet by mouth daily.   VITAMIN B-12 (CYANOCOBALAMIN ) 1000 MCG TABLET    Take 1 tablet (1,000 mcg total) by mouth daily.  Modified Medications   No medications on file  Discontinued Medications   No medications on file    Physical Exam:  Vitals:   09/15/23 0929  BP: 126/78  Pulse: 65  Resp: 16  Temp: (!) 97.3 F (36.3 C)  SpO2: 95%  Weight: 159 lb 9.6 oz (72.4 kg)  Height: 5' 1 (1.549 m)   Body mass index is 30.16 kg/m. Wt Readings from Last 3 Encounters:  09/15/23 159 lb 9.6 oz (72.4 kg)  03/10/23 161 lb (73 kg)  08/15/22 160 lb (72.6 kg)    Physical Exam Constitutional:      General: She is not in acute distress.    Appearance: She is well-developed. She is not diaphoretic.  HENT:     Head: Normocephalic and atraumatic.     Mouth/Throat:     Pharynx: No oropharyngeal exudate.  Eyes:     Conjunctiva/sclera: Conjunctivae normal.     Pupils: Pupils are equal, round, and reactive to light.  Cardiovascular:     Rate and Rhythm: Normal rate and regular rhythm.     Heart sounds: Normal heart sounds.  Pulmonary:     Effort: Pulmonary effort is normal.     Breath sounds: Normal breath sounds.  Abdominal:     General: Bowel sounds are normal.     Palpations: Abdomen is soft.  Musculoskeletal:     Cervical back: Normal range of motion and neck supple.     Right lower leg: No edema.     Left lower leg: No edema.  Skin:    General: Skin is warm and dry.  Neurological:     Mental Status: She is alert.  Psychiatric:        Mood and Affect: Mood normal.     Labs reviewed: Basic Metabolic Panel: No results for input(s):  NA, K, CL, CO2, GLUCOSE, BUN, CREATININE, CALCIUM , MG, PHOS, TSH in the last 8760 hours. Liver Function Tests: No results for input(s): AST, ALT, ALKPHOS, BILITOT, PROT, ALBUMIN in the last 8760 hours. No results for input(s): LIPASE, AMYLASE in the last 8760 hours. No results for input(s): AMMONIA in the last 8760 hours. CBC: No results for input(s): WBC, NEUTROABS, HGB, HCT, MCV,  PLT in the last 8760 hours. Lipid Panel: No results for input(s): CHOL, HDL, LDLCALC, TRIG, CHOLHDL, LDLDIRECT in the last 8760 hours. TSH: No results for input(s): TSH in the last 8760 hours. A1C: No results found for: HGBA1C   Assessment/Plan  Primary osteoarthritis of knee, unspecified laterality -     Comprehensive metabolic panel with GFR -     CBC with Differential/Platelet  OAB (overactive bladder) Assessment & Plan: Ongoing, does not feel like medication was effective Continue lifestyle modifications.    Mixed hyperlipidemia Assessment & Plan: Discussed need for lipid control. Decrease risk of MI and CVA Recommend dietary modifications   Orders: -     Comprehensive metabolic panel with GFR -     Lipid panel  Mood disorder [facility]) Assessment & Plan: Ongoing, will add namenda  to see if this helps.    Osteopenia, unspecified location Assessment & Plan: Recommended to take calcium  600 mg twice daily with Vitamin D  2000 units daily and weight bearing activity 30 mins/5 days a week  Orders: -     Comprehensive metabolic panel with GFR -     CBC with Differential/Platelet  B12 deficiency Assessment & Plan: Continue supplement  Orders: -     CBC with Differential/Platelet  Cognitive impairment Assessment & Plan: Decline in cognitive status Continues with support from family Continues aricept  Add namenda  titration.   Orders: -     Memantine  HCl ER; Take 1 capsule (7 mg total) by mouth daily for 7 days, THEN 2  capsules (14 mg total) daily for 7 days, THEN 3 capsules (21 mg total) daily for 7 days, THEN 4 capsules (28 mg total) daily for 10 days.  Dispense: 82 capsule; Refill: 0 -     Memantine  HCl ER; Take 1 capsule (28 mg total) by mouth daily.  Dispense: 30 capsule; Refill: 3  Need for influenza vaccination -     Flu vaccine HIGH DOSE PF(Fluzone Trivalent)  Unilateral primary osteoarthritis, right knee Assessment & Plan: Stable at this time Tylenol 325 mg 2 tablets every 6 hours as needed for pain.   Other orders -     Spikevax ; Inject 0.5 mLs into the muscle as directed.  Dispense: 0.5 mL; Refill: 1     Return in about 6 months (around 03/14/2024) for routine follow up.  Karmella Bouvier K. Caro BODILY [facility] [phone]

## 2023-09-16 LAB — COMPREHENSIVE METABOLIC PANEL WITH GFR
AG Ratio: 1.6 (calc) (ref 1.0–2.5)
ALT: 19 U/L (ref 6–29)
AST: 22 U/L (ref 10–35)
Albumin: 4.2 g/dL (ref 3.6–5.1)
Alkaline phosphatase (APISO): 87 U/L (ref 37–153)
BUN: 11 mg/dL (ref 7–25)
CO2: 28 mmol/L (ref 20–32)
Calcium: 10 mg/dL (ref 8.6–10.4)
Chloride: 105 mmol/L (ref 98–110)
Creat: 0.86 mg/dL (ref 0.60–0.95)
Globulin: 2.6 g/dL (ref 1.9–3.7)
Glucose, Bld: 99 mg/dL (ref 65–99)
Potassium: 4.3 mmol/L (ref 3.5–5.3)
Sodium: 140 mmol/L (ref 135–146)
Total Bilirubin: 0.5 mg/dL (ref 0.2–1.2)
Total Protein: 6.8 g/dL (ref 6.1–8.1)
eGFR: 66 mL/min/1.73m2 (ref >=60)

## 2023-09-16 LAB — CBC WITH DIFFERENTIAL/PLATELET
Absolute Lymphocytes: 1287 {cells}/uL (ref 850–3900)
Absolute Monocytes: 481 {cells}/uL (ref 200–950)
Basophils Absolute: 52 {cells}/uL (ref 0–200)
Basophils Relative: 0.8 %
Eosinophils Absolute: 72 {cells}/uL (ref 15–500)
Eosinophils Relative: 1.1 %
HCT: 43.2 % (ref 35.0–45.0)
Hemoglobin: 14.1 g/dL (ref 11.7–15.5)
MCH: 29 pg (ref 27.0–33.0)
MCHC: 32.6 g/dL (ref 32.0–36.0)
MCV: 88.9 fL (ref 80.0–100.0)
MPV: 10 fL (ref 7.5–12.5)
Monocytes Relative: 7.4 %
Neutro Abs: 4609 {cells}/uL (ref 1500–7800)
Neutrophils Relative %: 70.9 %
Platelets: 293 Thousand/uL (ref 140–400)
RBC: 4.86 Million/uL (ref 3.80–5.10)
RDW: 12.5 % (ref 11.0–15.0)
Total Lymphocyte: 19.8 %
WBC: 6.5 Thousand/uL (ref 3.8–10.8)

## 2023-09-16 LAB — LIPID PANEL
Cholesterol: 260 mg/dL — ABNORMAL HIGH (ref <200)
HDL: 95 mg/dL (ref >=50)
LDL Cholesterol (Calc): 147 mg/dL — ABNORMAL HIGH
Non-HDL Cholesterol (Calc): 165 mg/dL — ABNORMAL HIGH (ref <130)
Total CHOL/HDL Ratio: 2.7 (calc) (ref <5.0)
Triglycerides: 77 mg/dL (ref <150)

## 2023-09-18 ENCOUNTER — Other Ambulatory Visit: Payer: Self-pay | Admitting: Nurse Practitioner

## 2023-09-18 ENCOUNTER — Ambulatory Visit: Payer: Self-pay | Admitting: Nurse Practitioner

## 2023-09-18 DIAGNOSIS — M8580 Other specified disorders of bone density and structure, unspecified site: Secondary | ICD-10-CM | POA: Insufficient documentation

## 2023-09-18 DIAGNOSIS — E782 Mixed hyperlipidemia: Secondary | ICD-10-CM

## 2023-09-18 DIAGNOSIS — R4189 Other symptoms and signs involving cognitive functions and awareness: Secondary | ICD-10-CM | POA: Insufficient documentation

## 2023-09-18 MED ORDER — ROSUVASTATIN CALCIUM 5 MG PO TABS: 5.0000 mg | ORAL_TABLET | Freq: Every day | ORAL | 3 refills | Status: AC

## 2023-09-18 NOTE — Assessment & Plan Note (Signed)
 Stable at this time Tylenol 325 mg 2 tablets every 6 hours as needed for pain.

## 2023-09-18 NOTE — Assessment & Plan Note (Signed)
 Ongoing, does not feel like medication was effective Continue lifestyle modifications.

## 2023-09-18 NOTE — Assessment & Plan Note (Signed)
 Discussed need for lipid control. Decrease risk of MI and CVA Recommend dietary modifications

## 2023-09-18 NOTE — Progress Notes (Signed)
 Rx for crestor  send it, lets have her come back in 4 weeks to follow up liver enzymes.

## 2023-09-18 NOTE — Assessment & Plan Note (Signed)
Recommended to take calcium 600 mg twice daily with Vitamin D 2000 units daily and weight bearing activity 30 mins/5 days a week   

## 2023-09-18 NOTE — Assessment & Plan Note (Signed)
 Continue supplement.

## 2023-09-18 NOTE — Assessment & Plan Note (Signed)
 Ongoing, will add namenda  to see if this helps.

## 2023-09-18 NOTE — Assessment & Plan Note (Signed)
 Decline in cognitive status Continues with support from family Continues aricept  Add namenda  titration.

## 2023-09-26 ENCOUNTER — Telehealth: Payer: Self-pay

## 2023-09-26 NOTE — Telephone Encounter (Signed)
 [facility] has faxed over Prior Authorization request for medication Namenda  7mg . I called [facility] [phone] and initiated PA over the phone. I also faxed recent office notes. Currently under review and will take 72hours for response. Case # [id number].

## 2023-09-29 NOTE — Telephone Encounter (Signed)
 Copied from CRM [id number]. Topic: General - Other >> Sep 29, 2023 12:36 PM Mercer PEDLAR wrote: Reason for CRM: [facility], daughter is calling regarding medication memantine  (NAMENDA  XR) 7 MG CP24 24 hr capsule which was recently prescribed by Ms. Harlene An which had specific directions and was denied by insurance. Sharlet stated that [employer] faxed a form to clinic which needs to be completed and faxed back for appeal to be processed. She would like a callback with status update when form is completed.

## 2023-10-06 ENCOUNTER — Other Ambulatory Visit: Payer: Self-pay | Admitting: Adult Health

## 2023-10-06 ENCOUNTER — Telehealth: Payer: Self-pay

## 2023-10-06 DIAGNOSIS — R4189 Other symptoms and signs involving cognitive functions and awareness: Secondary | ICD-10-CM

## 2023-10-06 MED ORDER — MEMANTINE HCL ER 7 MG PO CP24: 7.0000 mg | ORAL_CAPSULE | Freq: Every day | ORAL | 1 refills | Status: DC

## 2023-10-06 NOTE — Telephone Encounter (Signed)
 [name], NP to [facility] (Selected Message)     10/06/23  1:05 PM Completed   [name] is aware

## 2023-10-06 NOTE — Telephone Encounter (Signed)
 Copied from CRM [id number]. Topic: General - Other >> Sep 29, 2023 12:36 PM Mercer PEDLAR wrote: Reason for CRM: [facility], daughter is calling regarding medication memantine  (NAMENDA  XR) 7 MG CP24 24 hr capsule which was recently prescribed by Ms. Harlene An which had specific directions and was denied by insurance. Sharlet stated that [employer] faxed a form to clinic which needs to be completed and faxed back for appeal to be processed. She would like a callback with status update when form is completed. >> Oct 06, 2023 12:46 PM Chiquita SQUIBB wrote: Patients daughter is calling in again regarding the medication asking if a two month supply for the 7 MG can be called in and she will pick up one month and not use it and then pick up the second month and use the entire supply how the doctor recommended and then move on to the higher dose if the 7 MG are good with the patient.

## 2023-10-09 ENCOUNTER — Telehealth: Payer: Self-pay

## 2023-10-09 DIAGNOSIS — R4189 Other symptoms and signs involving cognitive functions and awareness: Secondary | ICD-10-CM

## 2023-10-09 NOTE — Telephone Encounter (Signed)
 Was prescription filled as requested on 10/06/2023?

## 2023-10-09 NOTE — Telephone Encounter (Signed)
**Note De-identified  [place] Obfuscation** Please advise 

## 2023-10-10 MED ORDER — MEMANTINE HCL ER 7 MG PO CP24: 7.0000 mg | ORAL_CAPSULE | Freq: Every day | ORAL | 1 refills | Status: AC

## 2023-10-10 NOTE — Telephone Encounter (Signed)
 May contact pharmacy if there's any other availability in another pharmacy we can send prescription.

## 2023-10-10 NOTE — Telephone Encounter (Signed)
 The message from the pharmacy indicated that the medication is on back order

## 2023-10-10 NOTE — Telephone Encounter (Signed)
 I called patients daughter Holley and she wold like rx sent to [facility], as [facility] told her that [facility] has it in stock. RX sent as requested

## 2023-10-27 ENCOUNTER — Other Ambulatory Visit: Payer: Self-pay | Admitting: Nurse Practitioner

## 2023-10-27 ENCOUNTER — Telehealth: Payer: Self-pay

## 2023-10-27 NOTE — Telephone Encounter (Signed)
 Copied from CRM [id number]. Topic: Clinical - Prescription Issue >> Oct 27, 2023 11:04 AM [name] wrote: Reason for CRM: patients daughter calling cause the patient only has 3 pills left and she went to pick up prescription from [facility] and it was not available yet  And daughter would like to pick up the prescription as soon as possible   Daughter Holley Gails [phone]  [id number] - [place], [place] - [address] AT [facility] [address] [place] [address] Phone: [phone] Fax: [phone]  donepezil  (ARICEPT ) 5 MG tablet  Daughter has been called to let her know that it has been sent to her pharmacy

## 2024-03-11 ENCOUNTER — Ambulatory Visit: Payer: Self-pay | Admitting: Nurse Practitioner

## 2024-03-15 ENCOUNTER — Ambulatory Visit: Payer: Self-pay | Admitting: Nurse Practitioner

## 2024-03-15 ENCOUNTER — Ambulatory Visit: Admitting: Nurse Practitioner
